# Patient Record
Sex: Male | Born: 1950 | Race: White | Hispanic: No | Marital: Married | Smoking: Never smoker
Health system: Southern US, Community
[De-identification: ages and names within clinical notes are randomized; demographics above are authoritative.]

## PROBLEM LIST (undated history)

## (undated) DIAGNOSIS — L988 Other specified disorders of the skin and subcutaneous tissue: Secondary | ICD-10-CM

## (undated) DIAGNOSIS — I519 Heart disease, unspecified: Secondary | ICD-10-CM

## (undated) DIAGNOSIS — D649 Anemia, unspecified: Secondary | ICD-10-CM

## (undated) DIAGNOSIS — N200 Calculus of kidney: Secondary | ICD-10-CM

## (undated) DIAGNOSIS — E11319 Type 2 diabetes mellitus with unspecified diabetic retinopathy without macular edema: Secondary | ICD-10-CM

## (undated) DIAGNOSIS — N529 Male erectile dysfunction, unspecified: Secondary | ICD-10-CM

## (undated) DIAGNOSIS — N400 Enlarged prostate without lower urinary tract symptoms: Secondary | ICD-10-CM

## (undated) DIAGNOSIS — K219 Gastro-esophageal reflux disease without esophagitis: Secondary | ICD-10-CM

## (undated) DIAGNOSIS — Z87442 Personal history of urinary calculi: Secondary | ICD-10-CM

## (undated) DIAGNOSIS — E785 Hyperlipidemia, unspecified: Secondary | ICD-10-CM

## (undated) DIAGNOSIS — I10 Essential (primary) hypertension: Secondary | ICD-10-CM

## (undated) DIAGNOSIS — E119 Type 2 diabetes mellitus without complications: Secondary | ICD-10-CM

## (undated) DIAGNOSIS — E663 Overweight: Secondary | ICD-10-CM

## (undated) DIAGNOSIS — G4730 Sleep apnea, unspecified: Secondary | ICD-10-CM

## (undated) HISTORY — DX: Type 2 diabetes mellitus without complications: E11.9

## (undated) HISTORY — DX: Hyperlipidemia, unspecified: E78.5

## (undated) HISTORY — DX: Gastro-esophageal reflux disease without esophagitis: K21.9

## (undated) HISTORY — DX: Essential (primary) hypertension: I10

## (undated) HISTORY — DX: Heart disease, unspecified: I51.9

## (undated) HISTORY — DX: Benign prostatic hyperplasia without lower urinary tract symptoms: N40.0

## (undated) HISTORY — DX: Male erectile dysfunction, unspecified: N52.9

## (undated) HISTORY — PX: COLONOSCOPY: SHX174

## (undated) HISTORY — DX: Overweight: E66.3

## (undated) HISTORY — DX: Sleep apnea, unspecified: G47.30

## (undated) HISTORY — DX: Calculus of kidney: N20.0

---

## 2003-01-01 HISTORY — PX: ESOPHAGOGASTRODUODENOSCOPY: SHX1529

## 2005-10-12 ENCOUNTER — Ambulatory Visit: Payer: Self-pay | Admitting: Gastroenterology

## 2006-10-17 ENCOUNTER — Ambulatory Visit: Payer: Self-pay | Admitting: Orthopaedic Surgery

## 2006-12-04 ENCOUNTER — Ambulatory Visit: Payer: Self-pay | Admitting: Anesthesiology

## 2006-12-25 ENCOUNTER — Ambulatory Visit: Payer: Self-pay | Admitting: Anesthesiology

## 2007-02-12 ENCOUNTER — Ambulatory Visit: Payer: Self-pay | Admitting: Anesthesiology

## 2009-01-22 HISTORY — PX: BACK SURGERY: SHX140

## 2009-03-01 ENCOUNTER — Ambulatory Visit: Payer: Self-pay | Admitting: Family Medicine

## 2009-06-22 HISTORY — PX: LUMBAR LAMINECTOMY: SHX95

## 2009-09-28 ENCOUNTER — Ambulatory Visit: Payer: Self-pay | Admitting: Neurosurgery

## 2011-02-22 ENCOUNTER — Ambulatory Visit: Payer: Self-pay | Admitting: Gastroenterology

## 2014-12-24 ENCOUNTER — Encounter: Payer: Self-pay | Admitting: *Deleted

## 2014-12-29 ENCOUNTER — Encounter: Payer: Self-pay | Admitting: Urology

## 2014-12-29 ENCOUNTER — Ambulatory Visit (INDEPENDENT_AMBULATORY_CARE_PROVIDER_SITE_OTHER): Payer: Commercial Managed Care - HMO | Admitting: Urology

## 2014-12-29 VITALS — BP 124/64 | HR 69 | Ht 68.0 in | Wt 237.4 lb

## 2014-12-29 DIAGNOSIS — N138 Other obstructive and reflux uropathy: Secondary | ICD-10-CM

## 2014-12-29 DIAGNOSIS — N528 Other male erectile dysfunction: Secondary | ICD-10-CM | POA: Diagnosis not present

## 2014-12-29 DIAGNOSIS — N529 Male erectile dysfunction, unspecified: Secondary | ICD-10-CM

## 2014-12-29 DIAGNOSIS — N401 Enlarged prostate with lower urinary tract symptoms: Secondary | ICD-10-CM | POA: Diagnosis not present

## 2014-12-29 NOTE — Addendum Note (Signed)
Addended by: Michiel CowboyMCGOWAN, Anaija Wissink A on: 12/29/2014 02:36 PM   Modules accepted: Orders, Medications

## 2014-12-29 NOTE — Progress Notes (Signed)
12/29/2014 10:24 AM   Shawn Stevens Mar 08, 1950 [id number]  Referring provider: No referring provider defined for this encounter.  Chief Complaint  Patient presents with  . Benign Prostatic Hypertrophy    1 year recheck    HPI: Patient is a 64 year old white male with BPH with LUTS who presents today for a one year follow up.  BPH WITH LUTS His IPSS score today is 11, which is moderate lower urinary tract symptomatology. He is mostly satisfied with his quality life due to his urinary symptoms.  His major complaint today urgency and post-void dribbling.  He has had these symptoms for over the last two  years.  He denies any dysuria, hematuria or suprapubic pain.  He is not taking tamsulosin 0.4mg  daily because of the side effects.   He also denies any recent fevers, chills, nausea or vomiting.   He does not have a family history of PCa.      IPSS      12/29/14 0900       International Prostate Symptom Score   How often have you had the sensation of not emptying your bladder? Less than 1 in 5     How often have you had to urinate less than every two hours? About half the time     How often have you found you stopped and started again several times when you urinated? Less than half the time     How often have you found it difficult to postpone urination? Almost always     How often have you had a weak urinary stream? Not at All     How often have you had to strain to start urination? Not at All     How many times did you typically get up at night to urinate? None     Total IPSS Score 11     Quality of Life due to urinary symptoms   If you were to spend the rest of your life with your urinary condition just the way it is now how would you feel about that? Mostly Satisfied        Score:  1-7 Mild 8-19 Moderate 20-35 Severe  ERECTILE DYSFUNCTION Patient has had successful erections with the PDE5-inhibitors, but he could not tolerate the side effects.  He and his wife are  comfortable with other expressions of intimacy.      PMH: Past Medical History  Diagnosis Date  . BPH (benign prostatic hypertrophy)   . HTN (hypertension)   . ED (erectile dysfunction)   . Heart disease   . Diabetes mellitus (HCC)   . GERD (gastroesophageal reflux disease)   . HLD (hyperlipidemia)   . Sleep apnea   . Over weight   . Kidney stone     Surgical History: Past Surgical History  Procedure Laterality Date  . Back surgery  2011    Home Medications:    Medication List       This list is accurate as of: 12/29/14 10:24 AM.  Always use your most recent med list.               aspirin EC 81 MG tablet  Take 81 mg by mouth daily.     ferrous sulfate 325 (65 FE) MG tablet  Take by mouth.     insulin NPH-regular Human (70-30) 100 UNIT/ML injection  Commonly known as:  NOVOLIN 70/30  Inject into the skin.     lisinopril 20 MG tablet  Commonly known as:  PRINIVIL,ZESTRIL  Take 20 mg by mouth daily.     Magnesium 250 MG Tabs  Take by mouth.     metFORMIN 1000 MG tablet  Commonly known as:  GLUCOPHAGE  Take 1,000 mg by mouth 2 (two) times daily with a meal.     omeprazole 20 MG capsule  Commonly known as:  PRILOSEC  Take 20 mg by mouth every other day.     pyridoxine 100 MG tablet  Commonly known as:  B-6  Take by mouth.     RELION PRIME TEST test strip  Generic drug:  glucose blood  Use 1 strip via meter twice daily as directed.     simvastatin 10 MG tablet  Commonly known as:  ZOCOR  Take 5 mg by mouth daily.     tamsulosin 0.4 MG Caps capsule  Commonly known as:  FLOMAX  Take 0.4 mg by mouth daily.     VITAMIN B-12 PO  Take by mouth.     VITAMIN D-1000 MAX ST 1000 UNITS tablet  Generic drug:  Cholecalciferol  Take by mouth.        Allergies: No Known Allergies  Family History: Family History  Problem Relation Age of Onset  . Pancreatic cancer Maternal Grandmother   . Heart disease Father   . Kidney disease Neg Hx   .  Prostate cancer Neg Hx     Social History:  reports that he has never smoked. He does not have any smokeless tobacco history on file. He reports that he does not drink alcohol or use illicit drugs.  ROS: UROLOGY Frequent Urination?: No Hard to postpone urination?: Yes Burning/pain with urination?: No Get up at night to urinate?: No Leakage of urine?: Yes Urine stream starts and stops?: Yes Trouble starting stream?: No Do you have to strain to urinate?: No Blood in urine?: No Urinary tract infection?: No Sexually transmitted disease?: No Injury to kidneys or bladder?: No Painful intercourse?: No Weak stream?: No Erection problems?: Yes Penile pain?: No  Gastrointestinal Nausea?: No Vomiting?: No Indigestion/heartburn?: No Diarrhea?: No Constipation?: No  Constitutional Fever: No Night sweats?: No Weight loss?: No Fatigue?: No  Skin Skin rash/lesions?: No Itching?: No  Eyes Blurred vision?: No Double vision?: No  Ears/Nose/Throat Sore throat?: No Sinus problems?: No  Hematologic/Lymphatic Swollen glands?: No Easy bruising?: No  Cardiovascular Leg swelling?: No Chest pain?: No  Respiratory Cough?: No Shortness of breath?: No  Endocrine Excessive thirst?: No  Musculoskeletal Back pain?: No Joint pain?: No  Neurological Headaches?: No Dizziness?: No  Psychologic Depression?: No Anxiety?: No  Physical Exam: BP 124/64 mmHg  Pulse 69  Ht  (1.727 m)  Wt 237 lb 6.4 oz (107.684 kg)  BMI 36.10 kg/m2  GU: No CVA tenderness.  No bladder fullness or masses.  Patient with uncircumcised phallus.  Foreskin easily retracted  Urethral meatus is patent.  No penile discharge. No penile lesions or rashes. Scrotum without lesions, cysts, rashes and/or edema.  Testicles are located scrotally bilaterally. No masses are appreciated in the testicles. Left and right epididymis are normal. Rectal: Patient with  normal sphincter tone. Anus and perineum without  scarring or rashes. No rectal masses are appreciated. Prostate is approximately 45 grams, no nodules are appreciated. Seminal vesicles are normal.   Laboratory Data: PSA History  0.2 ng/mL on 01/08/2012  0.1 ng/mL on 01/07/2013  0.1 ng/mL on 01/07/2014   Assessment & Plan:    1. BPH (benign prostatic hyperplasia) with  LUTS:   IPSS score is 11/2.  He is not bothered by his symptoms and does not want to pursue any  therapies.  He will RTC in one year for IPSS score, exam and PSA.    - PSA  2. Erectile dysfunction:   Patient has ED, but he cannot tolerate the side effects of the PDE5-inhibitors.  He and his wife have discussed this situation and they do not want to pursue any other treatments at this time.   Return in about 1 year (around 12/29/2015) for IPSS score and exam.  Michiel CowboySHANNON Damonta Cossey, [facility]  [facility] [address], [address] [place], [place] [address] [phone]

## 2014-12-30 ENCOUNTER — Telehealth: Payer: Self-pay

## 2014-12-30 LAB — PSA: Prostate Specific Ag, Serum: 0.2 ng/mL (ref 0.0–4.0)

## 2014-12-30 NOTE — Telephone Encounter (Signed)
-----   Message from Harle BattiestShannon A McGowan, PA-C sent at 12/30/2014  8:11 AM EST ----- Patient's PSA is stable.  We will see him in 12 months.  PSA to be drawn before his next appointment.

## 2014-12-30 NOTE — Telephone Encounter (Signed)
Spoke with pt wife, Richrd SoxJoannie, in reference to PSA results. Wife voiced understanding.

## 2015-12-20 ENCOUNTER — Other Ambulatory Visit: Payer: Self-pay

## 2015-12-20 DIAGNOSIS — N401 Enlarged prostate with lower urinary tract symptoms: Secondary | ICD-10-CM

## 2015-12-22 ENCOUNTER — Other Ambulatory Visit: Payer: Commercial Managed Care - HMO

## 2015-12-22 DIAGNOSIS — N401 Enlarged prostate with lower urinary tract symptoms: Secondary | ICD-10-CM

## 2015-12-23 LAB — PSA: PROSTATE SPECIFIC AG, SERUM: 0.2 ng/mL (ref 0.0–4.0)

## 2015-12-29 ENCOUNTER — Encounter: Payer: Self-pay | Admitting: Urology

## 2015-12-29 ENCOUNTER — Ambulatory Visit (INDEPENDENT_AMBULATORY_CARE_PROVIDER_SITE_OTHER): Payer: Commercial Managed Care - HMO | Admitting: Urology

## 2015-12-29 VITALS — BP 133/77 | HR 71 | Ht 68.0 in | Wt 237.3 lb

## 2015-12-29 DIAGNOSIS — N400 Enlarged prostate without lower urinary tract symptoms: Secondary | ICD-10-CM | POA: Diagnosis not present

## 2015-12-29 DIAGNOSIS — N138 Other obstructive and reflux uropathy: Secondary | ICD-10-CM | POA: Diagnosis not present

## 2015-12-29 DIAGNOSIS — N401 Enlarged prostate with lower urinary tract symptoms: Secondary | ICD-10-CM | POA: Diagnosis not present

## 2015-12-29 DIAGNOSIS — N529 Male erectile dysfunction, unspecified: Secondary | ICD-10-CM | POA: Diagnosis not present

## 2015-12-29 LAB — BLADDER SCAN AMB NON-IMAGING: Scan Result: 40

## 2015-12-29 NOTE — Progress Notes (Signed)
12/29/2015 10:08 AM   Shawn Stevens 1951-01-09 [id number]  Referring provider: Myrene Buddy, NP [address] [facility] [place], [place] [address]  Chief Complaint  Patient presents with  . Benign Prostatic Hypertrophy    1 year follow up  . Erectile Dysfunction    HPI: Patient is a 64 year old Caucasian male with BPH with LUTS who presents today for a one year follow up.  BPH WITH LUTS His IPSS score today is 13, which is moderate lower urinary tract symptomatology. He is mostly satisfied with his quality life due to his urinary symptoms.  His PVR is 40 mL.  His previous IPSS score was 11/2.  His major complaint today urgency and post-void dribbling.  He has had these symptoms for over the last three years.  He denies any dysuria, hematuria or suprapubic pain.  He is not taking tamsulosin 0.4mg  daily because of the side effects.   He also denies any recent fevers, chills, nausea or vomiting.   He does not have a family history of PCa.      IPSS    Row Name 12/29/15 0900         International Prostate Symptom Score   How often have you had the sensation of not emptying your bladder? Less than 1 in 5     How often have you had to urinate less than every two hours? About half the time     How often have you found you stopped and started again several times when you urinated? About half the time     How often have you found it difficult to postpone urination? About half the time     How often have you had a weak urinary stream? Less than 1 in 5 times     How often have you had to strain to start urination? Less than 1 in 5 times     How many times did you typically get up at night to urinate? 1 Time     Total IPSS Score 13       Quality of Life due to urinary symptoms   If you were to spend the rest of your life with your urinary condition just the way it is now how would you feel about that? Mostly Satisfied        Score:  1-7  Mild 8-19 Moderate 20-35 Severe  ERECTILE DYSFUNCTION Patient has had successful erections with the PDE5-inhibitors, but he could not tolerate the side effects.  He and his wife are comfortable with other expressions of intimacy.      PMH: Past Medical History:  Diagnosis Date  . BPH (benign prostatic hypertrophy)   . Diabetes mellitus (HCC)   . ED (erectile dysfunction)   . GERD (gastroesophageal reflux disease)   . Heart disease   . HLD (hyperlipidemia)   . HTN (hypertension)   . Kidney stone   . Over weight   . Sleep apnea     Surgical History: Past Surgical History:  Procedure Laterality Date  . BACK SURGERY  2011    Home Medications:    Medication List       Accurate as of 12/29/15 10:08 AM. Always use your most recent med list.          aspirin EC 81 MG tablet Take 81 mg by mouth daily.   ferrous sulfate 325 (65 FE) MG tablet Take by mouth.   insulin NPH-regular Human (  70-30) 100 UNIT/ML injection Commonly known as:  NOVOLIN 70/30 Inject into the skin.   lisinopril 20 MG tablet Commonly known as:  PRINIVIL,ZESTRIL Take 20 mg by mouth daily.   Magnesium 250 MG Tabs Take by mouth.   metFORMIN 1000 MG tablet Commonly known as:  GLUCOPHAGE Take 1,000 mg by mouth 2 (two) times daily with a meal.   omeprazole 20 MG capsule Commonly known as:  PRILOSEC Take 20 mg by mouth every other day.   pyridoxine 100 MG tablet Commonly known as:  B-6 Take by mouth.   RELION PRIME TEST test strip Generic drug:  glucose blood Use 1 strip via meter twice daily as directed.   simvastatin 10 MG tablet Commonly known as:  ZOCOR Take 5 mg by mouth daily.   VITAMIN B-12 PO Take by mouth.   VITAMIN D-1000 MAX ST 1000 units tablet Generic drug:  Cholecalciferol Take by mouth.       Allergies: No Known Allergies  Family History: Family History  Problem Relation Age of Onset  . Pancreatic cancer Maternal Grandmother   . Heart disease Father   .  Kidney disease Neg Hx   . Prostate cancer Neg Hx   . Bladder Cancer Neg Hx     Social History:  reports that he has never smoked. He has never used smokeless tobacco. He reports that he does not drink alcohol or use drugs.  ROS: UROLOGY Frequent Urination?: No Hard to postpone urination?: No Burning/pain with urination?: No Get up at night to urinate?: No Leakage of urine?: Yes Urine stream starts and stops?: No Trouble starting stream?: No Do you have to strain to urinate?: No Blood in urine?: No Urinary tract infection?: No Sexually transmitted disease?: No Injury to kidneys or bladder?: No Painful intercourse?: No Weak stream?: No Erection problems?: Yes Penile pain?: No  Gastrointestinal Nausea?: No Vomiting?: No Indigestion/heartburn?: No Diarrhea?: No Constipation?: No  Constitutional Fever: No Night sweats?: No Weight loss?: No Fatigue?: No  Skin Skin rash/lesions?: No Itching?: No  Eyes Blurred vision?: No Double vision?: No  Ears/Nose/Throat Sore throat?: Yes Sinus problems?: No  Hematologic/Lymphatic Swollen glands?: No Easy bruising?: No  Cardiovascular Leg swelling?: No Chest pain?: No  Respiratory Cough?: Yes Shortness of breath?: No  Endocrine Excessive thirst?: No  Musculoskeletal Back pain?: No Joint pain?: No  Neurological Headaches?: No Dizziness?: No  Psychologic Depression?: No Anxiety?: No  Physical Exam: BP 133/77   Pulse 71   Ht 5\' 8"  (1.727 m)   Wt 237 lb 4.8 oz (107.6 kg)   BMI 36.08 kg/m   GU: No CVA tenderness.  No bladder fullness or masses.  Patient with uncircumcised phallus.  Foreskin easily retracted  Urethral meatus is patent.  No penile discharge. No penile lesions or rashes. Scrotum without lesions, cysts, rashes and/or edema.  Testicles are located scrotally bilaterally. No masses are appreciated in the testicles. Left and right epididymis are normal. Rectal: Patient with  normal sphincter tone.  Anus and perineum without scarring or rashes. No rectal masses are appreciated. Prostate is approximately 45 grams, no nodules are appreciated. Seminal vesicles are normal.   Laboratory Data: PSA History  0.2 ng/mL on 01/08/2012  0.1 ng/mL on 01/07/2013  0.1 ng/mL on 01/07/2014  0.2 ng/mL on 12/22/2015     Assessment & Plan:    1. BPH (benign prostatic hyperplasia) with LUTS:   IPSS score is 13/2.  He is not bothered by his symptoms and does not want to pursue  any  therapies.  He will RTC in one year for IPSS score, exam and PSA.    2. Erectile dysfunction:   Patient has ED, but he cannot tolerate the side effects of the PDE5-inhibitors.  He and his wife have discussed this situation and they do not want to pursue any other treatments at this time.   Return in about 1 year (around 12/28/2016) for IPSS, PSA and exam.  Michiel CowboySHANNON Lawson Mahone, [facility]  [facility] [address], [address] [place], [place] [address] [phone]

## 2016-12-27 NOTE — Progress Notes (Signed)
12/28/2016 7:43 PM   Shawn HouseholderJoseph A Stevens 04/04/1950 [id number]  Referring provider: Myrene BuddyGauger, Sarah Kathryn, NP [address] [place], [place] [address]  Chief Complaint  Patient presents with  . Benign Prostatic Hypertrophy    HPI: Patient is a 66 year old Caucasian male with BPH with LUTS who presents today for a one year follow up.  BPH WITH LUTS His IPSS score today is 12, which is moderate lower urinary tract symptomatology. He is pleased with his quality life due to his urinary symptoms.  His previous IPSS score was 13/2.  His previous PVR was 40 mL.  His major complaint today is post-void dribbling.  He has had these symptoms for over the last three years.  He denies any dysuria, hematuria or suprapubic pain.  He is not taking tamsulosin 0.4mg  daily because of the side effects.   He also denies any recent fevers, chills, nausea or vomiting.   He does not have a family history of PCa.  IPSS    Row Name 12/28/16 0900         International Prostate Symptom Score   How often have you had the sensation of not emptying your bladder?  Less than 1 in 5     How often have you had to urinate less than every two hours?  About half the time     How often have you found you stopped and started again several times when you urinated?  About half the time     How often have you found it difficult to postpone urination?  About half the time     How often have you had a weak urinary stream?  Less than 1 in 5 times     How often have you had to strain to start urination?  Less than 1 in 5 times     How many times did you typically get up at night to urinate?  None     Total IPSS Score  12       Quality of Life due to urinary symptoms   If you were to spend the rest of your life with your urinary condition just the way it is now how would you feel about that?  Pleased        Score:  1-7 Mild 8-19 Moderate 20-35 Severe  ERECTILE DYSFUNCTION Patient has had successful erections with the  PDE5-inhibitors, but he could not tolerate the side effects.  He and his wife are comfortable with other expressions of intimacy.      PMH: Past Medical History:  Diagnosis Date  . BPH (benign prostatic hypertrophy)   . Diabetes mellitus (HCC)   . ED (erectile dysfunction)   . GERD (gastroesophageal reflux disease)   . Heart disease   . HLD (hyperlipidemia)   . HTN (hypertension)   . Kidney stone   . Over weight   . Sleep apnea     Surgical History: Past Surgical History:  Procedure Laterality Date  . BACK SURGERY  2011    Home Medications:  Allergies as of 12/28/2016   No Known Allergies     Medication List        Accurate as of 12/28/16 11:59 PM. Always use your most recent med list.          aspirin EC 81 MG tablet Take 81 mg by mouth daily.   ferrous sulfate 325 (65 FE) MG tablet Take 325 mg by mouth daily with breakfast.   insulin  NPH-regular Human (70-30) 100 UNIT/ML injection Commonly known as:  NOVOLIN 70/30 Inject 50 Units into the skin 2 (two) times daily with a meal.   lisinopril 20 MG tablet Commonly known as:  PRINIVIL,ZESTRIL Take 20 mg by mouth daily.   Magnesium 250 MG Tabs Take 1 tablet by mouth daily.   metFORMIN 1000 MG tablet Commonly known as:  GLUCOPHAGE Take 1,000 mg by mouth 2 (two) times daily with a meal.   omeprazole 20 MG capsule Commonly known as:  PRILOSEC Take 20 mg by mouth every other day.   pyridoxine 100 MG tablet Commonly known as:  B-6 Take 100 mg by mouth daily.   RELION PRIME TEST test strip Generic drug:  glucose blood Use 1 strip via meter twice daily as directed.   simvastatin 10 MG tablet Commonly known as:  ZOCOR Take 5 mg by mouth daily.   VITAMIN B-12 PO Take 500 mg by mouth daily.   VITAMIN D-1000 MAX ST 1000 units tablet Generic drug:  Cholecalciferol Take 1,000 Units by mouth daily.       Allergies: No Known Allergies  Family History: Family History  Problem Relation Age of Onset  .  Pancreatic cancer Maternal Grandmother   . Heart disease Father   . Kidney disease Neg Hx   . Prostate cancer Neg Hx   . Bladder Cancer Neg Hx     Social History:  reports that  has never smoked. he has never used smokeless tobacco. He reports that he does not drink alcohol or use drugs.  ROS: UROLOGY Frequent Urination?: No Hard to postpone urination?: No Burning/pain with urination?: No Get up at night to urinate?: No Leakage of urine?: Yes Urine stream starts and stops?: No Trouble starting stream?: No Do you have to strain to urinate?: No Blood in urine?: No Urinary tract infection?: No Sexually transmitted disease?: No Injury to kidneys or bladder?: No Painful intercourse?: No Weak stream?: No Erection problems?: Yes Penile pain?: No  Gastrointestinal Nausea?: No Vomiting?: No Indigestion/heartburn?: No Diarrhea?: No Constipation?: No  Constitutional Fever: No Night sweats?: No Weight loss?: No Fatigue?: No  Skin Skin rash/lesions?: No Itching?: No  Eyes Blurred vision?: No Double vision?: No  Ears/Nose/Throat Sore throat?: No Sinus problems?: No  Hematologic/Lymphatic Swollen glands?: No Easy bruising?: No  Cardiovascular Leg swelling?: No Chest pain?: No  Respiratory Cough?: No Shortness of breath?: No  Endocrine Excessive thirst?: No  Musculoskeletal Back pain?: No Joint pain?: No  Neurological Headaches?: No Dizziness?: No  Psychologic Depression?: No Anxiety?: No  Physical Exam: BP (!) 141/72   Pulse 66   Ht 5\' 8"  (1.727 m)   Wt 233 lb (105.7 kg)   BMI 35.43 kg/m   GU: No CVA tenderness.  No bladder fullness or masses.  Patient with uncircumcised phallus.  Foreskin easily retracted  Urethral meatus is patent.  No penile discharge. No penile lesions or rashes. Scrotum without lesions, cysts, rashes and/or edema.  Testicles are located scrotally bilaterally. No masses are appreciated in the testicles. Left and right  epididymis are normal. Rectal: Patient with  normal sphincter tone. Anus and perineum without scarring or rashes. No rectal masses are appreciated. Prostate is approximately 45 grams, no nodules are appreciated. Seminal vesicles are normal.   Laboratory Data: PSA History  0.2 ng/mL on 01/08/2012  0.1 ng/mL on 01/07/2013  0.1 ng/mL on 01/07/2014  0.2 ng/mL on 12/22/2015  0.20 ng/mL on 12/28/2016   Assessment & Plan:    1. BPH (  benign prostatic hyperplasia) with LUTS:   IPSS score is 12/1.  He is not bothered by his symptoms and does not want to pursue any  therapies.  He will RTC in one year for IPSS score, exam and PSA.    2. Erectile dysfunction:   Patient has ED, but he cannot tolerate the side effects of the PDE5-inhibitors.  He and his wife have discussed this situation and they do not want to pursue any other treatments at this time.   Return in about 1 year (around 12/28/2017) for IPSS, PSA and exam.  Michiel CowboySHANNON Jeevan Kalla, [facility]  [facility] [address], [address] [place], [place] [address] [phone]

## 2016-12-28 ENCOUNTER — Other Ambulatory Visit
Admission: RE | Admit: 2016-12-28 | Discharge: 2016-12-28 | Disposition: A | Discharge status: Home / Self Care | Payer: Medicare Other | Source: Ambulatory Visit | Attending: Urology | Admitting: Urology

## 2016-12-28 ENCOUNTER — Encounter: Payer: Self-pay | Admitting: Urology

## 2016-12-28 ENCOUNTER — Ambulatory Visit (INDEPENDENT_AMBULATORY_CARE_PROVIDER_SITE_OTHER): Payer: Medicare Other | Admitting: Urology

## 2016-12-28 VITALS — BP 141/72 | HR 66 | Ht 68.0 in | Wt 233.0 lb

## 2016-12-28 DIAGNOSIS — E11319 Type 2 diabetes mellitus with unspecified diabetic retinopathy without macular edema: Secondary | ICD-10-CM | POA: Insufficient documentation

## 2016-12-28 DIAGNOSIS — G4730 Sleep apnea, unspecified: Secondary | ICD-10-CM | POA: Insufficient documentation

## 2016-12-28 DIAGNOSIS — K219 Gastro-esophageal reflux disease without esophagitis: Secondary | ICD-10-CM | POA: Insufficient documentation

## 2016-12-28 DIAGNOSIS — I10 Essential (primary) hypertension: Secondary | ICD-10-CM | POA: Insufficient documentation

## 2016-12-28 DIAGNOSIS — E785 Hyperlipidemia, unspecified: Secondary | ICD-10-CM | POA: Insufficient documentation

## 2016-12-28 DIAGNOSIS — N138 Other obstructive and reflux uropathy: Secondary | ICD-10-CM

## 2016-12-28 DIAGNOSIS — Z87442 Personal history of urinary calculi: Secondary | ICD-10-CM | POA: Diagnosis not present

## 2016-12-28 DIAGNOSIS — E119 Type 2 diabetes mellitus without complications: Secondary | ICD-10-CM | POA: Diagnosis not present

## 2016-12-28 DIAGNOSIS — N529 Male erectile dysfunction, unspecified: Secondary | ICD-10-CM

## 2016-12-28 DIAGNOSIS — N401 Enlarged prostate with lower urinary tract symptoms: Secondary | ICD-10-CM | POA: Insufficient documentation

## 2016-12-28 DIAGNOSIS — E669 Obesity, unspecified: Secondary | ICD-10-CM | POA: Insufficient documentation

## 2016-12-28 LAB — PSA: Prostatic Specific Antigen: 0.2 ng/mL (ref 0.00–4.00)

## 2017-01-01 ENCOUNTER — Telehealth: Payer: Self-pay

## 2017-01-01 NOTE — Telephone Encounter (Signed)
-----   Message from Harle BattiestShannon A McGowan, PA-C sent at 12/31/2016 10:10 AM EST ----- Please let Mr. Shawn Stevens know that his PSA was stable at 0.20.

## 2017-01-01 NOTE — Telephone Encounter (Signed)
Will send a letter

## 2017-01-22 HISTORY — PX: MOHS SURGERY: SHX181

## 2017-01-22 HISTORY — PX: CATARACT EXTRACTION, BILATERAL: SHX1313

## 2018-01-03 ENCOUNTER — Ambulatory Visit: Payer: Medicare Other | Admitting: Urology

## 2018-01-12 NOTE — Progress Notes (Signed)
01/13/2018 8:34 AM   Shawn HouseholderJoseph A Muench 12/30/1950 [id number]  Referring provider: Myrene BuddyGauger, Sarah Kathryn, NP [address] [place], [place] [address]  Chief Complaint  Patient presents with  . Benign Prostatic Hypertrophy    1 year    HPI: Patient is a 67 year old Caucasian male with BPH with LUTS who presents today for a one year follow up.  BPH WITH LUTS His IPSS score today is 10, which is moderate lower urinary tract symptomatology. He is mostly satisfied with his quality life due to his urinary symptoms.  His previous IPSS score was 12/1.  His previous PVR was 40 mL.  His major complaint today is urgency and post-void dribbling.  He is wearing a light incontinence pad.  He has had these symptoms for over the last three years.  He denies any dysuria, hematuria or suprapubic pain.  He is not taking tamsulosin 0.4mg  daily because of the side effects.   He also denies any recent fevers, chills, nausea or vomiting.   He does not have a family history of PCa. IPSS    Row Name 01/13/18 0800         International Prostate Symptom Score   How often have you had the sensation of not emptying your bladder?  Less than 1 in 5     How often have you had to urinate less than every two hours?  Less than half the time     How often have you found you stopped and started again several times when you urinated?  About half the time     How often have you found it difficult to postpone urination?  Less than half the time     How often have you had a weak urinary stream?  Less than 1 in 5 times     How often have you had to strain to start urination?  Less than 1 in 5 times     How many times did you typically get up at night to urinate?  None     Total IPSS Score  10       Quality of Life due to urinary symptoms   If you were to spend the rest of your life with your urinary condition just the way it is now how would you feel about that?  Mostly Satisfied        Score:  1-7 Mild 8-19  Moderate 20-35 Severe  ERECTILE DYSFUNCTION Patient has had successful erections with the PDE5-inhibitors, but he could not tolerate the side effects.  He and his wife are comfortable with other expressions of intimacy.      PMH: Past Medical History:  Diagnosis Date  . BPH (benign prostatic hypertrophy)   . Diabetes mellitus (HCC)   . ED (erectile dysfunction)   . GERD (gastroesophageal reflux disease)   . Heart disease   . HLD (hyperlipidemia)   . HTN (hypertension)   . Kidney stone   . Over weight   . Sleep apnea     Surgical History: Past Surgical History:  Procedure Laterality Date  . BACK SURGERY  2011    Home Medications:  Allergies as of 01/13/2018   No Known Allergies     Medication List       Accurate as of January 13, 2018  8:34 AM. Always use your most recent med list.        aspirin EC 81 MG tablet Take 81 mg by mouth daily.  ferrous sulfate 325 (65 FE) MG tablet Take 325 mg by mouth daily with breakfast.   insulin NPH-regular Human (70-30) 100 UNIT/ML injection Inject 50 Units into the skin 2 (two) times daily with a meal.   lisinopril 20 MG tablet Commonly known as:  PRINIVIL,ZESTRIL Take 20 mg by mouth daily.   Magnesium 250 MG Tabs Take 1 tablet by mouth daily.   metFORMIN 1000 MG tablet Commonly known as:  GLUCOPHAGE Take 1,000 mg by mouth 2 (two) times daily with a meal.   omeprazole 20 MG capsule Commonly known as:  PRILOSEC Take 20 mg by mouth every other day.   pyridoxine 100 MG tablet Commonly known as:  B-6 Take 100 mg by mouth daily.   RELION PRIME TEST test strip Generic drug:  glucose blood Use 1 strip via meter twice daily as directed.   simvastatin 10 MG tablet Commonly known as:  ZOCOR Take 5 mg by mouth daily.   VITAMIN B-12 PO Take 500 mg by mouth daily.   VITAMIN D-1000 MAX ST 25 MCG (1000 UT) tablet Generic drug:  Cholecalciferol Take 1,000 Units by mouth daily.       Allergies: No Known  Allergies  Family History: Family History  Problem Relation Age of Onset  . Pancreatic cancer Maternal Grandmother   . Heart disease Father   . Kidney disease Neg Hx   . Prostate cancer Neg Hx   . Bladder Cancer Neg Hx     Social History:  reports that he has never smoked. He has never used smokeless tobacco. He reports that he does not drink alcohol or use drugs.  ROS: UROLOGY Frequent Urination?: No Hard to postpone urination?: Yes Burning/pain with urination?: No Get up at night to urinate?: No Leakage of urine?: Yes Urine stream starts and stops?: No Trouble starting stream?: No Do you have to strain to urinate?: No Blood in urine?: No Urinary tract infection?: No Sexually transmitted disease?: No Injury to kidneys or bladder?: No Painful intercourse?: No Weak stream?: No Erection problems?: Yes Penile pain?: No  Gastrointestinal Nausea?: No Vomiting?: No Indigestion/heartburn?: No Diarrhea?: No Constipation?: No  Constitutional Fever: No Night sweats?: No Weight loss?: No Fatigue?: No  Skin Skin rash/lesions?: No Itching?: No  Eyes Blurred vision?: No Double vision?: No  Ears/Nose/Throat Sore throat?: No Sinus problems?: No  Hematologic/Lymphatic Swollen glands?: No Easy bruising?: No  Cardiovascular Leg swelling?: No Chest pain?: No  Respiratory Cough?: No Shortness of breath?: No  Endocrine Excessive thirst?: No  Musculoskeletal Back pain?: No Joint pain?: No  Neurological Headaches?: No Dizziness?: No  Psychologic Depression?: No Anxiety?: No  Physical Exam: BP 138/65   Pulse 76   Ht 5\' 8"  (1.727 m)   Wt 230 lb (104.3 kg)   BMI 34.97 kg/m   Constitutional: Well nourished. Alert and oriented, No acute distress. HEENT: [place] AT, moist mucus membranes. Trachea midline, no masses. Cardiovascular: No clubbing, cyanosis, or edema. Respiratory: Normal respiratory effort, no increased work of breathing. GI: Obese.  Abdomen  is soft, non tender, non distended, no abdominal masses. Liver and spleen not palpable.  No hernias appreciated.  Stool sample for occult testing is not indicated.   GU: No CVA tenderness.  No bladder fullness or masses.  Patient with uncircumcised phallus.  Foreskin easily retracted  Urethral meatus is patent.  No penile discharge. No penile lesions or rashes. Scrotum without lesions, cysts, rashes and/or edema.  Testicles are located scrotally bilaterally. No masses are appreciated in the  testicles. Left and right epididymis are normal. Rectal: Patient with  normal sphincter tone. Anus and perineum without scarring or rashes. No rectal masses are appreciated. Prostate is approximately 45 grams, no nodules are appreciated. Seminal vesicles are normal.  Scarring from piloidal cysts.  Skin: No rashes, bruises or suspicious lesions. Lymph: No cervical or inguinal adenopathy. Neurologic: Grossly intact, no focal deficits, moving all 4 extremities. Psychiatric: Normal mood and affect.   Laboratory Data: PSA History  0.2 ng/mL on 01/08/2012  0.1 ng/mL on 01/07/2013  0.13 in 10/2013  0.1 ng/mL on 01/07/2014  0.14 in 10/2015  0.2 ng/mL on 12/22/2015  0.20 ng/mL on 12/28/2016  0.12 in 10/2017 I have reviewed the labs.   Assessment & Plan:    1. BPH (benign prostatic hyperplasia) with LUTS:   IPSS score is 10/2.  It is stable.  He is not bothered by his symptoms and does not want to pursue any  therapies.  He will RTC in one year for IPSS score, exam and PSA.    2. Erectile dysfunction:   Patient has ED, but he cannot tolerate the side effects of the PDE5-inhibitors.  He and his wife have discussed this situation and they do not want to pursue any other treatments at this time.   Return in about 1 year (around 01/14/2019) for IPSS, PSA and exam.  Michiel CowboySHANNON Inocencia Murtaugh, [facility]  [facility] [address] [place], [place] [address] [phone]

## 2018-01-13 ENCOUNTER — Encounter: Payer: Self-pay | Admitting: Urology

## 2018-01-13 ENCOUNTER — Ambulatory Visit (INDEPENDENT_AMBULATORY_CARE_PROVIDER_SITE_OTHER): Payer: Medicare Other | Admitting: Urology

## 2018-01-13 VITALS — BP 138/65 | HR 76 | Ht 68.0 in | Wt 230.0 lb

## 2018-01-13 DIAGNOSIS — N529 Male erectile dysfunction, unspecified: Secondary | ICD-10-CM

## 2018-01-13 DIAGNOSIS — N138 Other obstructive and reflux uropathy: Secondary | ICD-10-CM

## 2018-01-13 DIAGNOSIS — N401 Enlarged prostate with lower urinary tract symptoms: Secondary | ICD-10-CM | POA: Diagnosis not present

## 2019-01-18 NOTE — Progress Notes (Signed)
01/19/2019 8:54 AM   Shawn Stevens [birth year] [id number]  Referring provider: Sallee Lange, NP [address] [place],  [place] [address]  Chief Complaint  Patient presents with  . Benign Prostatic Hypertrophy    HPI: Shawn Stevens is a 68 year old male with BPH with LUTS and ED who presents today for a one year follow up.  BPH WITH LUTS  (prostate and/or bladder) IPSS score: 12/2    Previous score: 12/1   Previous PVR: 40 mL  Major complaint(s):  Urgency, incontinence and intermittency x several years. Denies any dysuria, hematuria or suprapubic pain.   Has tried tamsulosin 0.4 mg and it didn't agree with him.    Denies any recent fevers, chills, nausea or vomiting.  He does not have a family history of PCa.  IPSS    Row Name 01/19/19 0800         International Prostate Symptom Score   How often have you had the sensation of not emptying your bladder?  Less than 1 in 5     How often have you had to urinate less than every two hours?  Less than half the time     How often have you found you stopped and started again several times when you urinated?  About half the time     How often have you found it difficult to postpone urination?  About half the time     How often have you had a weak urinary stream?  Less than half the time     How often have you had to strain to start urination?  Less than 1 in 5 times     How many times did you typically get up at night to urinate?  None     Total IPSS Score  12       Quality of Life due to urinary symptoms   If you were to spend the rest of your life with your urinary condition just the way it is now how would you feel about that?  Mostly Satisfied        Score:  1-7 Mild 8-19 Moderate 20-35 Severe   Erectile dysfunction SHIM score: 5     Main complaint: No erections  x several years Risk factors:  age, BPH, DM, HTN, HLD and sleep apnea.   No painful erections or curvatures with his erections.    No longer having  spontaneous erections.  Tried:  PDE5i's - but could not tolerate the side effects    SHIM    Row Name 01/19/19 0842         SHIM: Over the last 6 months:   How do you rate your confidence that you could get and keep an erection?  Very Low     When you had erections with sexual stimulation, how often were your erections hard enough for penetration (entering your partner)?  Almost Never or Never     During sexual intercourse, how often were you able to maintain your erection after you had penetrated (entered) your partner?  Almost Never or Never     During sexual intercourse, how difficult was it to maintain your erection to completion of intercourse?  Extremely Difficult     When you attempted sexual intercourse, how often was it satisfactory for you?  Almost Never or Never       SHIM Total Score   SHIM  5        Score: 1-7 Severe  ED 8-11 Moderate ED 12-16 Mild-Moderate ED 17-21 Mild ED 22-25 No ED      PMH: Past Medical History:  Diagnosis Date  . BPH (benign prostatic hypertrophy)   . Diabetes mellitus (HCC)   . ED (erectile dysfunction)   . GERD (gastroesophageal reflux disease)   . Heart disease   . HLD (hyperlipidemia)   . HTN (hypertension)   . Kidney stone   . Over weight   . Sleep apnea     Surgical History: Past Surgical History:  Procedure Laterality Date  . BACK SURGERY  2011    Home Medications:  Allergies as of 01/19/2019   No Known Allergies     Medication List       Accurate as of January 19, 2019  8:54 AM. If you have any questions, ask your nurse or doctor.        Aspirin Buf(CaCarb-MgCarb-MgO) 81 MG Tabs Take by mouth.   aspirin EC 81 MG tablet Take 81 mg by mouth daily.   ferrous sulfate 325 (65 FE) MG tablet Take 325 mg by mouth daily with breakfast.   insulin NPH-regular Human (70-30) 100 UNIT/ML injection Inject 50 Units into the skin 2 (two) times daily with a meal.   INSULIN SYRINGE .5CC/31GX5/16" 31G X 5/16" 0.5 ML  Misc as directed (twice daily insulin dosing.)   lisinopril 20 MG tablet Commonly known as: ZESTRIL Take 20 mg by mouth daily.   Magnesium 250 MG Tabs Take 1 tablet by mouth daily.   metFORMIN 1000 MG tablet Commonly known as: GLUCOPHAGE Take 1,000 mg by mouth 2 (two) times daily with a meal.   omeprazole 20 MG capsule Commonly known as: PRILOSEC Take 20 mg by mouth every other day.   pyridoxine 100 MG tablet Commonly known as: B-6 Take 100 mg by mouth daily.   ReliOn Prime Test test strip Generic drug: glucose blood Use 1 strip via meter twice daily as directed.   simvastatin 10 MG tablet Commonly known as: ZOCOR Take 5 mg by mouth daily.   VITAMIN B-12 PO Take 500 mg by mouth daily.   Vitamin D-1000 Max St 25 MCG (1000 UT) tablet Generic drug: Cholecalciferol Take 1,000 Units by mouth daily.       Allergies: No Known Allergies  Family History: Family History  Problem Relation Age of Onset  . Pancreatic cancer Maternal Grandmother   . Heart disease Father   . Kidney disease Neg Hx   . Prostate cancer Neg Hx   . Bladder Cancer Neg Hx     Social History:  reports that he has never smoked. He has never used smokeless tobacco. He reports that he does not drink alcohol or use drugs.  ROS: UROLOGY Frequent Urination?: No Hard to postpone urination?: Yes Burning/pain with urination?: No Get up at night to urinate?: No Leakage of urine?: Yes Urine stream starts and stops?: Yes Trouble starting stream?: No Do you have to strain to urinate?: No Blood in urine?: No Urinary tract infection?: No Sexually transmitted disease?: No Injury to kidneys or bladder?: No Painful intercourse?: No Weak stream?: No Erection problems?: Yes Penile pain?: No  Gastrointestinal Nausea?: No Vomiting?: No Indigestion/heartburn?: No Diarrhea?: No Constipation?: No  Constitutional Fever: No Night sweats?: No Weight loss?: No Fatigue?: No  Skin Skin rash/lesions?:  No Itching?: No  Eyes Blurred vision?: No Double vision?: No  Ears/Nose/Throat Sore throat?: No Sinus problems?: No  Hematologic/Lymphatic Swollen glands?: No Easy bruising?: No  Cardiovascular Leg swelling?: No  Chest pain?: No  Respiratory Cough?: No Shortness of breath?: No  Endocrine Excessive thirst?: No  Musculoskeletal Back pain?: No Joint pain?: No  Neurological Headaches?: No Dizziness?: No  Psychologic Depression?: No Anxiety?: No  Physical Exam: BP 120/74   Pulse 66   Ht 5\' 8"  (1.727 m)   Wt 231 lb 1.6 oz (104.8 kg)   BMI 35.14 kg/m   Constitutional:  Well nourished. Alert and oriented, No acute distress. HEENT: [place] AT, mask in place.  Trachea midline, no masses. Cardiovascular: No clubbing, cyanosis, or edema. Respiratory: Normal respiratory effort, no increased work of breathing. GI: Abdomen is soft, non tender, non distended, no abdominal masses. Liver and spleen not palpable.  Umbilical hernia appreciated.  Stool sample for occult testing is not indicated.   GU: No CVA tenderness.  No bladder fullness or masses.  Patient with uncircumcised phallus. Foreskin easily retracted  Urethral meatus is patent.  No penile discharge. No penile lesions or rashes. Scrotum without lesions, cysts, rashes and/or edema.  Testicles are located scrotally bilaterally. No masses are appreciated in the testicles. Left and right epididymis are normal. Rectal: Patient with  normal sphincter tone. Anus and perineum without scarring or rashes. No rectal masses are appreciated. Prostate is approximately 45 grams, no nodules are appreciated. Seminal vesicles are normal. Skin: No rashes, bruises or suspicious lesions. Lymph: No inguinal adenopathy. Neurologic: Grossly intact, no focal deficits, moving all 4 extremities. Psychiatric: Normal mood and affect.  Laboratory Data: PSA History  0.2 ng/mL on 01/08/2012  0.1 ng/mL on 01/07/2013  0.13 in 10/2013  0.1 ng/mL on  01/07/2014  0.14 in 10/2015  0.2 ng/mL on 12/22/2015  0.20 ng/mL on 12/28/2016  0.12 in 10/2017  Serum creatinine  0.8 in 12/2018  Hbg A1c   6.6% in 12/2018  I have reviewed the labs.   Assessment & Plan:    1. BPH with LUTS IPSS score is 12/2, it is stable Continue conservative management, avoiding bladder irritants and timed voiding's Most bothersome symptoms is/are frequency, but wants to continue to manage conservatively RTC in 12 months for IPSS, PSA and exam    2. Erectile dysfunction SHIM score is 5 Could not tolerate PDE5i's - not interested in other treatments RTC in 12 months for repeat SHIM score and exam    Return in about 1 year (around 01/19/2020) for IPSS, SHIM, PSA and exam.  01/21/2020, [facility]  [facility] [address] [place], [place] [phone]

## 2019-01-19 ENCOUNTER — Encounter: Payer: Self-pay | Admitting: Urology

## 2019-01-19 ENCOUNTER — Ambulatory Visit (INDEPENDENT_AMBULATORY_CARE_PROVIDER_SITE_OTHER): Payer: Medicare Other | Admitting: Urology

## 2019-01-19 ENCOUNTER — Other Ambulatory Visit: Payer: Self-pay

## 2019-01-19 ENCOUNTER — Ambulatory Visit: Payer: PRIVATE HEALTH INSURANCE | Admitting: Urology

## 2019-01-19 VITALS — BP 120/74 | HR 66 | Ht 68.0 in | Wt 231.1 lb

## 2019-01-19 DIAGNOSIS — N138 Other obstructive and reflux uropathy: Secondary | ICD-10-CM

## 2019-01-19 DIAGNOSIS — N529 Male erectile dysfunction, unspecified: Secondary | ICD-10-CM

## 2019-01-19 DIAGNOSIS — N401 Enlarged prostate with lower urinary tract symptoms: Secondary | ICD-10-CM | POA: Diagnosis not present

## 2019-01-20 LAB — PSA: Prostate Specific Ag, Serum: 0.2 ng/mL (ref 0.0–4.0)

## 2019-12-30 ENCOUNTER — Encounter: Payer: Self-pay | Admitting: Emergency Medicine

## 2019-12-30 ENCOUNTER — Ambulatory Visit
Admission: EM | Admit: 2019-12-30 | Discharge: 2019-12-30 | Disposition: A | Discharge status: Home / Self Care | Payer: Medicare Other | Attending: Family Medicine | Admitting: Family Medicine

## 2019-12-30 ENCOUNTER — Other Ambulatory Visit: Payer: Self-pay

## 2019-12-30 DIAGNOSIS — S60419A Abrasion of unspecified finger, initial encounter: Secondary | ICD-10-CM | POA: Diagnosis not present

## 2019-12-30 MED ORDER — MUPIROCIN 2 % EX OINT: 1.0000 "application " | TOPICAL_OINTMENT | Freq: Three times a day (TID) | CUTANEOUS | 0 refills | Status: AC

## 2019-12-30 NOTE — ED Provider Notes (Signed)
[facility]    CSN: [id number] Arrival date & time: 12/30/19  1459   History   Chief Complaint Chief Complaint  Patient presents with  . Finger Injury    left pointer finger   HPI  69 year old male presents with the above complaint.  Patient states that several days ago he injured his left index finger.  He believes that he suffered a cut or an abrasion.  He is unsure of how it occurred.  He states that for the past few days the area has been red and has been slightly tender.  He has cleaned it with peroxide and used antibiotic ointment without significant improvement.  He is concerned that it is getting infected.  No purulent drainage.  No other associated symptoms.  No other complaints.  Past Medical History:  Diagnosis Date  . BPH (benign prostatic hypertrophy)   . Diabetes mellitus (HCC)   . ED (erectile dysfunction)   . GERD (gastroesophageal reflux disease)   . Heart disease   . HLD (hyperlipidemia)   . HTN (hypertension)   . Kidney stone   . Over weight   . Sleep apnea     Patient Active Problem List   Diagnosis Date Noted  . Sleep apnea 12/28/2016  . Obesity (BMI 30-39.9) 12/28/2016  . Hypertension 12/28/2016  . Hyperlipidemia, unspecified 12/28/2016  . GERD (gastroesophageal reflux disease) 12/28/2016  . Diabetic retinopathy associated with type 2 diabetes mellitus (HCC) 12/28/2016  . BPH with obstruction/lower urinary tract symptoms 12/29/2014  . 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 Diabetes mellitus, type 2 (HCC) 01/23/1991    Past Surgical History:  Procedure Laterality Date  . BACK SURGERY  2011       Home Medications    Prior to Admission medications   Medication Sig Start Date End Date Taking? Authorizing Provider  Aspirin Buf,CaCarb-MgCarb-MgO, 81 MG TABS Take by mouth. 05/27/09  Yes [provider]  aspirin EC 81 MG tablet Take 81 mg by mouth daily.   Yes [provider]  Cholecalciferol (VITAMIN D-1000 MAX  ST) 1000 UNITS tablet Take 1,000 Units by mouth daily.    Yes [provider]  Cyanocobalamin (VITAMIN B-12 PO) Take 500 mg by mouth daily.    Yes [provider]  ferrous sulfate 325 (65 FE) MG tablet Take 325 mg by mouth daily with breakfast.    Yes [provider]  glucose blood (RELION PRIME TEST) test strip Use 1 strip via meter twice daily as directed. 10/29/14  Yes [provider]  insulin NPH-regular Human (NOVOLIN 70/30) (70-30) 100 UNIT/ML injection Inject 50 Units into the skin 2 (two) times daily with a meal.    Yes [provider]  Insulin Syringe-Needle U-100 (INSULIN SYRINGE .5CC/31GX5/16") 31G X 5/16" 0.5 ML MISC as directed (twice daily insulin dosing.) 01/13/19  Yes [provider]  lisinopril (PRINIVIL,ZESTRIL) 20 MG tablet Take 20 mg by mouth daily.   Yes [provider]  Magnesium 250 MG TABS Take 1 tablet by mouth daily.    Yes [provider]  metFORMIN (GLUCOPHAGE) 1000 MG tablet Take 1,000 mg by mouth 2 (two) times daily with a meal.   Yes [provider]  omeprazole (PRILOSEC) 20 MG capsule Take 20 mg by mouth every other day.    Yes [provider]  pyridoxine (B-6) 100 MG tablet Take 100 mg by mouth daily.    Yes [provider]  simvastatin (ZOCOR) 10 MG tablet Take 5 mg by  mouth daily.    Yes [provider]  mupirocin ointment (BACTROBAN) 2 % Apply 1 application topically 3 (three) times daily for 7 days. 12/30/19 01/06/20  Shawn Sams, DO    Family History Family History  Problem Relation Age of Onset  . Pancreatic cancer Maternal Grandmother   . Heart disease Father   . Kidney disease Neg Hx   . Prostate cancer Neg Hx   . Bladder Cancer Neg Hx     Social History Social History   Tobacco Use  . Smoking status: Never Smoker  . Smokeless tobacco: Never Used  Vaping Use  . Vaping Use: Never used  Substance Use Topics  . Alcohol use: No     Alcohol/week: 0.0 standard drinks  . Drug use: No     Allergies   Patient has no known allergies.   Review of Systems Review of Systems  Musculoskeletal:       Finger injury; redness   Physical Exam Triage Vital Signs ED Triage Vitals  Enc Vitals Group     BP 12/30/19 1552 130/70     Pulse Rate 12/30/19 1552 66     Resp 12/30/19 1552 18     Temp 12/30/19 1552 98.4 F (36.9 C)     Temp Source 12/30/19 1552 Oral     SpO2 12/30/19 1552 99 %     Weight 12/30/19 1550 225 lb (102.1 kg)     Height 12/30/19 1550 5\' 8"  (1.727 m)     Head Circumference --      Peak Flow --      Pain Score 12/30/19 1550 0     Pain Loc --      Pain Edu? --      Excl. in GC? --    Updated Vital Signs BP 130/70 (BP Location: Right Arm)   Pulse 66   Temp 98.4 F (36.9 C) (Oral)   Resp 18   Ht 5\' 8"  (1.727 m)   Wt 102.1 kg   SpO2 99%   BMI 34.21 kg/m   Visual Acuity Right Eye Distance:   Left Eye Distance:   Bilateral Distance:    Right Eye Near:   Left Eye Near:    Bilateral Near:     Physical Exam Vitals and nursing note reviewed.  Constitutional:      General: He is not in acute distress.    Appearance: Normal appearance. He is not ill-appearing.  HENT:     Head: Normocephalic and atraumatic.  Eyes:     General:        Right eye: No discharge.        Left eye: No discharge.     Conjunctiva/sclera: Conjunctivae normal.  Skin:    Comments: Left index finger - dorsum of the finger above the MCP joint with an area of erythema with central eschar.  Slightly tender to palpation.  Neurological:     Mental Status: He is alert.  Psychiatric:        Mood and Affect: Mood normal.        Behavior: Behavior normal.    UC Treatments / Results  Labs (all labs ordered are listed, but only abnormal results are displayed) Labs Reviewed - No data to display  EKG   Radiology No results found.  Procedures Procedures (including critical care time)  Medications Ordered in  UC Medications - No data to display  Initial Impression / Assessment and Plan / UC Course  I have reviewed  the triage vital signs and the nursing notes.  Pertinent labs & imaging results that were available during my care of the patient were reviewed by me and considered in my medical decision making (see chart for details).    69 year old male presents with an abrasion to his left index finger.  He has mild surrounding erythema and slight tenderness.  No fluctuance.  Placing on Bactroban ointment.  Advised to keep clean.  Advised to discontinue use of peroxide.  Final Clinical Impressions(s) / UC Diagnoses   Final diagnoses:  Abrasion of finger, initial encounter     Discharge Instructions     Medication as prescribed.  Take care  Dr. Adriana Simas    ED Prescriptions    Medication Sig Dispense Auth. Provider   mupirocin ointment (BACTROBAN) 2 % Apply 1 application topically 3 (three) times daily for 7 days. 30 g Shawn Sams, DO     PDMP not reviewed this encounter.   Shawn Stevens, [place] 12/30/19 1730

## 2019-12-30 NOTE — Discharge Instructions (Signed)
Medication as prescribed.  Take care  Dr. Dwanda Tufano  

## 2019-12-30 NOTE — ED Triage Notes (Signed)
Patient states he got a cut on his left finger x 1 week ago. He states he doesn't recall what he cut his finger on. He is c/o redness and pain at the site.

## 2020-01-04 ENCOUNTER — Other Ambulatory Visit: Payer: Self-pay

## 2020-01-04 ENCOUNTER — Other Ambulatory Visit
Admission: RE | Admit: 2020-01-04 | Discharge: 2020-01-04 | Disposition: A | Discharge status: Home / Self Care | Payer: Medicare Other | Attending: Urology | Admitting: Urology

## 2020-01-04 ENCOUNTER — Other Ambulatory Visit: Payer: Self-pay | Admitting: *Deleted

## 2020-01-04 DIAGNOSIS — N138 Other obstructive and reflux uropathy: Secondary | ICD-10-CM | POA: Diagnosis present

## 2020-01-04 DIAGNOSIS — N401 Enlarged prostate with lower urinary tract symptoms: Secondary | ICD-10-CM | POA: Insufficient documentation

## 2020-01-04 LAB — PSA: Prostatic Specific Antigen: 0.16 ng/mL (ref 0.00–4.00)

## 2020-01-08 ENCOUNTER — Other Ambulatory Visit: Payer: Self-pay | Admitting: *Deleted

## 2020-01-08 DIAGNOSIS — N138 Other obstructive and reflux uropathy: Secondary | ICD-10-CM

## 2020-01-10 NOTE — Progress Notes (Signed)
01/11/2020 9:03 AM   Shawn Stevens 1950-09-02 [id number]  Referring provider: Sallee Lange, NP [address] [place],  [place] [address]  Chief Complaint  Patient presents with  . Benign Prostatic Hypertrophy    1year follow up    HPI: Shawn Stevens is a 69 year old male with BPH with LUTS and ED who presents today for a one year follow up.  BPH WITH LUTS  (prostate and/or bladder) IPSS score: 9/2    Previous score: 12/2   Previous PVR: 40 mL  Major complaint(s):  Post void dribbling.  Patient denies any modifying or aggravating factors.  Patient denies any gross hematuria, dysuria or suprapubic/flank pain.  Patient denies any fevers, chills, nausea or vomiting.   Has tried tamsulosin 0.4 mg and it didn't agree with him.    He does not have a family history of PCa.   IPSS    Row Name 01/11/20 0800         International Prostate Symptom Score   How often have you had the sensation of not emptying your bladder? Less than 1 in 5     How often have you had to urinate less than every two hours? Less than half the time     How often have you found you stopped and started again several times when you urinated? Less than half the time     How often have you found it difficult to postpone urination? Less than half the time     How often have you had a weak urinary stream? Less than 1 in 5 times     How often have you had to strain to start urination? Less than 1 in 5 times     How many times did you typically get up at night to urinate? None     Total IPSS Score 9           Quality of Life due to urinary symptoms   If you were to spend the rest of your life with your urinary condition just the way it is now how would you feel about that? Mostly Satisfied            Score:  1-7 Mild 8-19 Moderate 20-35 Severe   Erectile dysfunction Previous SHIM score: 5     Main complaint: No erections  x several years Risk factors:  age, BPH, DM, HTN, HLD and sleep apnea.    No painful erections or curvatures with his erections.    No longer having spontaneous erections.  Tried:  PDE5i's - but could not tolerate the side effects      PMH: Past Medical History:  Diagnosis Date  . BPH (benign prostatic hypertrophy)   . Diabetes mellitus ([facility])   . ED (erectile dysfunction)   . GERD (gastroesophageal reflux disease)   . Heart disease   . HLD (hyperlipidemia)   . HTN (hypertension)   . Kidney stone   . Over weight   . Sleep apnea     Surgical History: Past Surgical History:  Procedure Laterality Date  . BACK SURGERY  2011    Home Medications:  Allergies as of 01/11/2020   No Known Allergies     Medication List       Accurate as of January 11, 2020  9:03 AM. If you have any questions, ask your nurse or doctor.        STOP taking these medications   aspirin EC 81 MG tablet  Stopped by: Zara Council, PA-C     TAKE these medications   Aspirin Buf(CaCarb-MgCarb-MgO) 81 MG Tabs Take by mouth.   Cholecalciferol 25 MCG (1000 UT) tablet Take 1,000 Units by mouth daily.   ferrous sulfate 325 (65 FE) MG tablet Take 325 mg by mouth daily with breakfast.   glucose blood test strip Use 1 strip via meter twice daily as directed.   insulin NPH-regular Human (70-30) 100 UNIT/ML injection Inject 50 Units into the skin 2 (two) times daily with a meal.   INSULIN SYRINGE .5CC/31GX5/16" 31G X 5/16" 0.5 ML Misc as directed (twice daily insulin dosing.)   lisinopril 20 MG tablet Commonly known as: ZESTRIL Take 20 mg by mouth daily.   Magnesium 250 MG Tabs Take 1 tablet by mouth daily.   metFORMIN 1000 MG tablet Commonly known as: GLUCOPHAGE Take 1,000 mg by mouth 2 (two) times daily with a meal.   omeprazole 20 MG capsule Commonly known as: PRILOSEC Take 20 mg by mouth every other day.   pyridoxine 100 MG tablet Commonly known as: B-6 Take 100 mg by mouth daily.   simvastatin 10 MG tablet Commonly known as: ZOCOR Take 5 mg by  mouth daily.   VITAMIN B-12 PO Take 500 mg by mouth daily.       Allergies: No Known Allergies  Family History: Family History  Problem Relation Age of Onset  . Pancreatic cancer Maternal Grandmother   . Heart disease Father   . Kidney disease Neg Hx   . Prostate cancer Neg Hx   . Bladder Cancer Neg Hx     Social History:  reports that he has never smoked. He has never used smokeless tobacco. He reports that he does not drink alcohol and does not use drugs.  ROS: For pertinent review of systems please refer to history of present illness  Physical Exam: BP 135/62   Pulse 63   Ht 5' 8"  (1.727 m)   Wt 225 lb (102.1 kg)   BMI 34.21 kg/m   Constitutional:  Well nourished. Alert and oriented, No acute distress. HEENT: [place] AT, mask in place.  Trachea midline Cardiovascular: No clubbing, cyanosis, or edema. Respiratory: Normal respiratory effort, no increased work of breathing. GU: No CVA tenderness.  No bladder fullness or masses.  Patient with uncircumcised phallus.  Foreskin easily retracted  Urethral meatus is patent.  No penile discharge. No penile lesions or rashes. Scrotum without lesions, cysts, rashes and/or edema.  Testicles are located scrotally bilaterally. No masses are appreciated in the testicles. Left and right epididymis are normal. Rectal: Patient with  normal sphincter tone. Anus and perineum without scarring or rashes. No rectal masses are appreciated. Prostate is approximately 50 grams, no nodules are appreciated. Seminal vesicles could not be palpated.  A scarred area is located at the upper right of the gluteal fold which is an sebaceous cyst that becomes infected and drains from time to time.  There is no erythema, fluctuance or crepitus on today's exam Lymph: No inguinal adenopathy. Neurologic: Grossly intact, no focal deficits, moving all 4 extremities. Psychiatric: Normal mood and affect.  Laboratory Data: Urinalysis Component     Latest Ref Rng & Units  01/11/2020  Color, Urine     YELLOW YELLOW  Appearance     CLEAR CLEAR  Specific Gravity, Urine     1.005 - 1.030 1.025  pH     5.0 - 8.0 5.0  Glucose, UA     NEGATIVE mg/dL NEGATIVE  Hgb urine dipstick     NEGATIVE NEGATIVE  Bilirubin Urine     NEGATIVE NEGATIVE  Ketones, ur     NEGATIVE mg/dL TRACE (A)  Protein     NEGATIVE mg/dL NEGATIVE  Nitrite     NEGATIVE NEGATIVE  Leukocytes,Ua     NEGATIVE NEGATIVE  Squamous Epithelial / LPF     0 - 5 0-5  WBC, UA     0 - 5 WBC/hpf NONE SEEN  RBC / HPF     0 - 5 RBC/hpf 0-5  Bacteria, UA     NONE SEEN NONE SEEN    PSA History  0.2 ng/mL on 01/08/2012  0.1 ng/mL on 01/07/2013  0.13 in 10/2013  0.1 ng/mL on 01/07/2014  0.14 in 10/2015  0.2 ng/mL on 12/22/2015  0.12 in 10/2017 Component     Latest Ref Rng & Units 12/28/2016 01/04/2020  Prostatic Specific Antigen     0.00 - 4.00 ng/mL 0.20 0.16   Specimen:  Blood  Ref Range & Units 12 mo ago  Glucose 70 - 110 mg/dL 125High   Sodium 136 - 145 mmol/L 139   Potassium 3.6 - 5.1 mmol/L 4.6   Chloride 97 - 109 mmol/L 103   Carbon Dioxide (CO2) 22 - 32 mmol/L 29.1   Urea Nitrogen (BUN) 7 - 25 mg/dL 20   Creatinine 0.7 - 1.3 mg/dL 0.8   Glomerular Filtration Rate (eGFR), MDRD Estimate >60 mL/min/1.73sq m 96   Calcium 8.7 - 10.3 mg/dL 9.6   AST  8 - 39 U/L 25   ALT  6 - 57 U/L 30   Alk Phos (alkaline Phosphatase) 34 - 104 U/L 43   Albumin 3.5 - 4.8 g/dL 4.3   Bilirubin, Total 0.3 - 1.2 mg/dL 0.5   Protein, Total 6.1 - 7.9 g/dL 6.5   A/G Ratio 1 - 5 gm/dL 2.0   Resulting Agency  [facility]  Specimen Collected: 01/13/19 9:46 AM Last Resulted: 01/14/19 10:28 AM  Received From: [facility]  Result Received: 01/18/19 3:22 PM     Specimen:  Blood  Ref Range & Units 6 mo ago  Hemoglobin A1C 4.2 - 5.6 % 7.3High   Average Blood Glucose (Calc) mg/dL 163   Resulting Agency  [facility] - [facility]   Narrative  Normal Range:  4.2  - 5.6%  Increased Risk: 5.7 - 6.4%  Diabetes:    >= 6.5%  Glycemic Control for adults with diabetes: <7%  Specimen Collected: 07/14/19 8:54 AM Last Resulted: 07/14/19 12:27 PM  Received From: [facility]  Result Received: 12/30/19 3:00 PM  I have reviewed the labs.   Assessment & Plan:    1. BPH with LUTS IPSS score is 12/2, it is stable Continue conservative management, avoiding bladder irritants and timed voiding's Most bothersome symptoms is/are post void dribbling - advised patient to press up behind the scrotum to facilitate complete emptying  RTC in 12 months for IPSS, PSA and exam    2. Erectile dysfunction SHIM score is 5 Could not tolerate PDE5i's - not interested in other treatments  Return in about 1 year (around 01/10/2021) for IPSS, PSA and exam.  Zara Council, [facility]  [address] [place], [place] [address] [phone]

## 2020-01-11 ENCOUNTER — Ambulatory Visit (INDEPENDENT_AMBULATORY_CARE_PROVIDER_SITE_OTHER): Payer: Medicare Other | Admitting: Urology

## 2020-01-11 ENCOUNTER — Encounter: Payer: Self-pay | Admitting: Urology

## 2020-01-11 ENCOUNTER — Other Ambulatory Visit: Payer: Self-pay

## 2020-01-11 ENCOUNTER — Other Ambulatory Visit
Admission: RE | Admit: 2020-01-11 | Discharge: 2020-01-11 | Disposition: A | Discharge status: Home / Self Care | Payer: Medicare Other | Attending: Urology | Admitting: Urology

## 2020-01-11 VITALS — BP 135/62 | HR 63 | Ht 68.0 in | Wt 225.0 lb

## 2020-01-11 DIAGNOSIS — N138 Other obstructive and reflux uropathy: Secondary | ICD-10-CM

## 2020-01-11 DIAGNOSIS — N401 Enlarged prostate with lower urinary tract symptoms: Secondary | ICD-10-CM | POA: Insufficient documentation

## 2020-01-11 LAB — URINALYSIS, COMPLETE (UACMP) WITH MICROSCOPIC
Bacteria, UA: NONE SEEN
Bilirubin Urine: NEGATIVE
Glucose, UA: NEGATIVE mg/dL
Hgb urine dipstick: NEGATIVE
Leukocytes,Ua: NEGATIVE
Nitrite: NEGATIVE
Protein, ur: NEGATIVE mg/dL
Specific Gravity, Urine: 1.025 (ref 1.005–1.030)
WBC, UA: NONE SEEN WBC/hpf (ref 0–5)
pH: 5 (ref 5.0–8.0)

## 2020-04-22 DIAGNOSIS — U071 COVID-19: Secondary | ICD-10-CM

## 2020-04-22 HISTORY — DX: COVID-19: U07.1

## 2020-06-27 ENCOUNTER — Ambulatory Visit
Admission: RE | Admit: 2020-06-27 | Discharge: 2020-06-27 | Disposition: A | Discharge status: Home / Self Care | Payer: Medicare Other | Source: Ambulatory Visit | Attending: Urology | Admitting: Urology

## 2020-06-27 ENCOUNTER — Ambulatory Visit
Admission: RE | Admit: 2020-06-27 | Discharge: 2020-06-27 | Disposition: A | Discharge status: Home / Self Care | Payer: Medicare Other | Attending: Urology | Admitting: Urology

## 2020-06-27 ENCOUNTER — Other Ambulatory Visit: Payer: Self-pay | Admitting: *Deleted

## 2020-06-27 DIAGNOSIS — N200 Calculus of kidney: Secondary | ICD-10-CM

## 2020-06-27 NOTE — Progress Notes (Signed)
06/28/2020 9:49 AM   Shawn Stevens [birth year] [id number]  Referring provider: Sallee Lange, NP [address] [place],  [place] [address]  Chief Complaint  Patient presents with  . Nephrolithiasis   Urological history: 1. BPH with LU TS -PSA 0.16 in 12/2019  2. ED -contributing factors of age,  BPH, DM, HTN, HLD and sleep apnea  3. Sleep apnea -sleeps with CPAP  4. Nephrolithiasis -Passed a stone spontaneously in the 1980s  HPI: Shawn ALOMAR is a 70 y.o. male who presents today for possible kidney stone.  He states that last Tuesday morning he had the sudden onset of left-sided flank pain that radiated to his left waist that woke him up.  Since that time the pain has been intermittent.  He is having some nausea and dry heaves with the pain.  He denies fever, chills and gross hematuria.  He also is not having any other urinary symptoms such as dysuria.    His UA is positive for calcium oxalate crystals otherwise negative.  KUB taken yesterday demonstrates a 5 mm proximal left ureteral stone.   CT Renal stone study today confirms the stone and it is associated with hydronephrosis.   PMH: Past Medical History:  Diagnosis Date  . BPH (benign prostatic hypertrophy)   . Diabetes mellitus (Shawn Stevens)   . ED (erectile dysfunction)   . GERD (gastroesophageal reflux disease)   . Heart disease   . HLD (hyperlipidemia)   . HTN (hypertension)   . Kidney stone   . Over weight   . Sleep apnea     Surgical History: Past Surgical History:  Procedure Laterality Date  . BACK SURGERY  2011    Home Medications:  Allergies as of 06/28/2020   No Known Allergies     Medication List       Accurate as of June 28, 2020  9:49 AM. If you have any questions, ask your nurse or doctor.        Aspirin Buf(CaCarb-MgCarb-MgO) 81 MG Tabs Take by mouth.   Cholecalciferol 25 MCG (1000 UT) tablet Take 1,000 Units by mouth daily.   ferrous sulfate 325 (65 FE) MG tablet Take  325 mg by mouth daily with breakfast.   glucose blood test strip Use 1 strip via meter twice daily as directed.   insulin NPH-regular Human (70-30) 100 UNIT/ML injection Inject 50 Units into the skin 2 (two) times daily with a meal.   INSULIN SYRINGE .5CC/31GX5/16" 31G X 5/16" 0.5 ML Misc as directed (twice daily insulin dosing.)   lisinopril 20 MG tablet Commonly known as: ZESTRIL Take 20 mg by mouth daily.   Magnesium 250 MG Tabs Take 1 tablet by mouth daily.   metFORMIN 1000 MG tablet Commonly known as: GLUCOPHAGE Take 1,000 mg by mouth 2 (two) times daily with a meal.   omeprazole 20 MG capsule Commonly known as: PRILOSEC Take 20 mg by mouth every other day.   pyridoxine 100 MG tablet Commonly known as: B-6 Take 100 mg by mouth daily.   simvastatin 10 MG tablet Commonly known as: ZOCOR Take 5 mg by mouth daily.   VITAMIN B-12 PO Take 500 mg by mouth daily.       Allergies: No Known Allergies  Family History: Family History  Problem Relation Age of Onset  . Pancreatic cancer Maternal Grandmother   . Heart disease Father   . Kidney disease Neg Hx   . Prostate cancer Neg Hx   . Bladder Cancer  Neg Hx     Social History:  reports that he has never smoked. He has never used smokeless tobacco. He reports that he does not drink alcohol and does not use drugs.  ROS: For pertinent review of systems please refer to history of present illness  Physical Exam: BP (!) 149/75   Pulse 73   Ht 5' 8"  (1.727 m)   Wt 225 lb (102.1 kg)   BMI 34.21 kg/m   Constitutional:  Well nourished. Alert and oriented, No acute distress. HEENT: [place] AT, mask in place.  Trachea midline Cardiovascular: No clubbing, cyanosis, or edema. Respiratory: Normal respiratory effort, no increased work of breathing. Neurologic: Grossly intact, no focal deficits, moving all 4 extremities. Psychiatric: Normal mood and affect.  Laboratory Data: WBC (White Blood Cell Count) 4.1 - 10.2 10^3/uL  8.5   RBC (Red Blood Cell Count) 4.69 - 6.13 10^6/uL 5.21   Hemoglobin 14.1 - 18.1 gm/dL 15.7   Hematocrit 40.0 - 52.0 % 47.4   MCV (Mean Corpuscular Volume) 80.0 - 100.0 fl 91.0   MCH (Mean Corpuscular Hemoglobin) 27.0 - 31.2 pg 30.1   MCHC (Mean Corpuscular Hemoglobin Concentration) 32.0 - 36.0 gm/dL 33.1   Platelet Count 150 - 450 10^3/uL 241   RDW-CV (Red Cell Distribution Width) 11.6 - 14.8 % 12.7   MPV (Mean Platelet Volume) 9.4 - 12.4 fl 9.0Low   Neutrophils 1.50 - 7.80 10^3/uL 5.60   Lymphocytes 1.00 - 3.60 10^3/uL 2.10   Mixed Count 0.10 - 0.90 10^3/uL 0.80   Neutrophil % 32.0 - 70.0 % 65.7   Lymphocyte % 10.0 - 50.0 % 25.2   Mixed % 3.0 - 14.4 % 9.1   Resulting Agency  [facility] - [facility]  Specimen Collected: 01/28/20 10:48 Last Resulted: 01/28/20 11:24  Received From: [facility]  Result Received: 06/27/20 11:03   Glucose 70 - 110 mg/dL 94   Sodium 136 - 145 mmol/L 139   Potassium 3.6 - 5.1 mmol/L 5.1   Chloride 97 - 109 mmol/L 104   Carbon Dioxide (CO2) 22.0 - 32.0 mmol/L 30.8   Urea Nitrogen (BUN) 7 - 25 mg/dL 23   Creatinine 0.7 - 1.3 mg/dL 0.9   Glomerular Filtration Rate (eGFR), MDRD Estimate >60 mL/min/1.73sq m 84   Calcium 8.7 - 10.3 mg/dL 9.6   AST  8 - 39 U/L 26   ALT  6 - 57 U/L 29   Alk Phos (alkaline Phosphatase) 34 - 104 U/L 45   Albumin 3.5 - 4.8 g/dL 4.4   Bilirubin, Total 0.3 - 1.2 mg/dL 0.7   Protein, Total 6.1 - 7.9 g/dL 6.8   A/G Ratio 1.0 - 5.0 gm/dL 1.8   Resulting Agency  [facility] - [facility]  Specimen Collected: 01/28/20 10:48 Last Resulted: 01/28/20 16:47  Received From: [facility]  Result Received: 06/27/20 11:03   Hemoglobin A1C 4.2 - 5.6 % 6.4High   Average Blood Glucose (Calc) mg/dL 137   Resulting Agency  [facility] - [facility]   Narrative Performed by [facility] - [facility] Normal Range:  4.2 - 5.6%  Increased Risk: 5.7 - 6.4%  Diabetes:    >= 6.5%  Glycemic  Control for adults with diabetes: <7%  Specimen Collected: 01/28/20 10:48 Last Resulted: 01/28/20 17:31  Received From: [facility]  Result Received: 06/27/20 11:03   Cholesterol, Total 100 - 200 mg/dL 124   Triglyceride 35 - 199 mg/dL 66   HDL (  High Density Lipoprotein) Cholesterol 29.0 - 71.0 mg/dL 49.9   LDL Calculated 0 - 130 mg/dL 61   VLDL Cholesterol mg/dL 13   Cholesterol/HDL Ratio  2.5   [facility] - [facility]  Specimen Collected: 01/28/20 10:48 Last Resulted: 01/28/20 16:46  Received From: [facility]  Result Received: 06/27/20 11:03   Thyroid Stimulating Hormone (TSH) 0.450-5.330 uIU/ml uIU/mL 1.462   Resulting Agency  [facility] - [facility]  Specimen Collected: 01/28/20 10:48 Last Resulted: 01/28/20 18:59  Received From: [facility]  Result Received: 06/27/20 11:03   Urinalysis Component     Latest Ref Rng & Units 06/28/2020  Specific Gravity, UA     1.005 - 1.030 1.020  pH, UA     5.0 - 7.5 5.0  Color, UA     Yellow Yellow  Appearance Ur     Clear Clear  Leukocytes,UA     Negative Negative  Protein,UA     Negative/Trace Negative  Glucose, UA     Negative Negative  Ketones, UA     Negative Trace (A)  RBC, UA     Negative Negative  Bilirubin, UA     Negative Negative  Urobilinogen, Ur     0.2 - 1.0 mg/dL 1.0  Nitrite, UA     Negative Negative  Microscopic Examination      See below:   Component     Latest Ref Rng & Units 06/28/2020  WBC, UA     0 - 5 /hpf 0-5  RBC     0 - 2 /hpf 0-2  Epithelial Cells (non renal)     0 - 10 /hpf 0-10  Crystals     N/A Present (A)  Crystal Type     N/A Calcium Oxalate  Bacteria, UA     Shawn Stevens seen/Few Shawn Stevens seen  I have reviewed the labs.  Pertinent Imaging CLINICAL DATA:  Left-sided flank pain for 1 week  EXAM: ABDOMEN - 1 VIEW  COMPARISON:  Shawn Stevens.  FINDINGS: Scattered large and small bowel gas is noted. There is a 3-4  mm calcification identified in the expected region of the proximal left ureter. No definitive renal calculi are seen. No distal ureteral stones are noted. Diffuse degenerative changes of the lumbar spine are noted.  IMPRESSION: 3-4 mm calcification in the expected region of the proximal left ureter. CT urography may be helpful as clinically indicated.   Electronically Signed   By: Inez Catalina M.D.   On: 06/28/2020 11:39  CLINICAL DATA:  Left flank pain  EXAM: CT ABDOMEN AND PELVIS WITHOUT CONTRAST  TECHNIQUE: Multidetector CT imaging of the abdomen and pelvis was performed following the standard protocol without IV contrast.  COMPARISON:  Shawn Stevens.  FINDINGS: Lower chest: Lung bases are clear.  Hepatobiliary: Unenhanced liver is unremarkable.  Gallbladder is unremarkable. No intrahepatic or extrahepatic ductal dilatation.  Pancreas: Within normal limits.  Spleen: Within normal limits.  Adrenals/Urinary Tract: Adrenal glands are within normal limits.  Kidneys are within normal limits.  Mild left hydronephrosis.  4 mm proximal left ureteral calculus at the L2-3 level.  Bladder is within normal limits.  Stomach/Bowel: Stomach is within normal limits.  No evidence of bowel obstruction.  Normal appendix (series 2/image 64).  Vascular/Lymphatic: No evidence of abdominal aortic aneurysm.  Atherosclerotic calcifications of the abdominal aorta and branch vessels.  No suspicious abdominopelvic lymphadenopathy.  Reproductive: Prostate is unremarkable.  Other: No abdominopelvic ascites.  Musculoskeletal: Degenerative changes of  the visualized thoracolumbar spine.  IMPRESSION: 4 mm proximal left ureteral calculus at the L2-3 level. Mild left hydronephrosis.   Electronically Signed   By: Julian Hy M.D.   On: 06/28/2020 09:59 I have independently reviewed the films.  See HPI.   Assessment & Plan:    1. Left UVJ  stone -We discussed various treatment options for urolithiasis including observation with or without medical expulsive therapy, shockwave lithotripsy (SWL), ureteroscopy and laser lithotripsy with stent placement. -We discussed that management is based on stone size, location, density, patient co-morbidities, and patient preference.  -Stones <39m in size have a >80% spontaneous passage rate. Data surrounding the use of tamsulosin for medical expulsive therapy is controversial, but meta analyses suggests it is most efficacious for distal stones between 5-17min size. Possible side effects include dizziness/lightheadedness -SWL has a lower stone free rate in a single procedure, but also a lower complication rate compared to ureteroscopy and avoids a stent and associated stent related symptoms. Possible complications include renal hematoma, steinstrasse, and need for additional treatment. -Ureteroscopy with laser lithotripsy and stent placement has a higher stone free rate than SWL in a single procedure, however increased complication rate including possible infection, ureteral injury, bleeding, and stent related morbidity. Common stent related symptoms include dysuria, urgency/frequency, and flank pain. -Due to his daily aspirin, he is in eligible for ESWL this Thursday so he would like to pursue ureteroscopy as we can address his stone earlier than next week - schedule left ureteroscopy with laser lithotripsy and ureteral stent placement - explained to the patient how the procedure is performed and the risks involved - informed patient that they will have a stent placed during the procedure and will remain in place after the procedure for a short time.  - stent may be removed in the office with a cystoscope or patient may be instructed to remove the stent themselves by the string - described "stent pain" as feelings of needing to urinate/overactive bladder and a warm, tingling sensation to intense pain in  the affected flank - residual stones within the kidney or ureter may be present after the procedure and may need to have these addressed at a different encounter - injury to the ureter is the most common intra-operative risk, it may result in an open procedure to correct the defect - infection and bleeding are also risks - explained the risks of general anesthesia, such as: MI, CVA, paralysis, coma and/or death. - advised to contact our office or seek treatment in the ED if becomes febrile or pain/ vomiting are difficult control in order to arrange for emergent/urgent intervention -UA -urine culture   2. Left flank pain -see #1     Return for left URS/LL/ureteral stent placement.  SHZara CouncilPA-C  [facility] [address] [id number]

## 2020-06-27 NOTE — H&P (View-Only) (Signed)
06/28/2020 9:49 AM   Shawn Stevens Jun 28, 1950 [id number]  Referring provider: Sallee Lange, NP [address] [place],  [place] [address]  Chief Complaint  Patient presents with  . Nephrolithiasis   Urological history: 1. BPH with LU TS -PSA 0.16 in 12/2019  2. ED -contributing factors of age,  BPH, DM, HTN, HLD and sleep apnea  3. Sleep apnea -sleeps with CPAP  4. Nephrolithiasis -Passed a stone spontaneously in the 1980s  HPI: Shawn Stevens is a 70 y.o. male who presents today for possible kidney stone.  He states that last Tuesday morning he had the sudden onset of left-sided flank pain that radiated to his left waist that woke him up.  Since that time the pain has been intermittent.  He is having some nausea and dry heaves with the pain.  He denies fever, chills and gross hematuria.  He also is not having any other urinary symptoms such as dysuria.    His UA is positive for calcium oxalate crystals otherwise negative.  KUB taken yesterday demonstrates a 5 mm proximal left ureteral stone.   CT Renal stone study today confirms the stone and it is associated with hydronephrosis.   PMH: Past Medical History:  Diagnosis Date  . BPH (benign prostatic hypertrophy)   . Diabetes mellitus ([facility])   . ED (erectile dysfunction)   . GERD (gastroesophageal reflux disease)   . Heart disease   . HLD (hyperlipidemia)   . HTN (hypertension)   . Kidney stone   . Over weight   . Sleep apnea     Surgical History: Past Surgical History:  Procedure Laterality Date  . BACK SURGERY  2011    Home Medications:  Allergies as of 06/28/2020   No Known Allergies     Medication List       Accurate as of June 28, 2020  9:49 AM. If you have any questions, ask your nurse or doctor.        Aspirin Buf(CaCarb-MgCarb-MgO) 81 MG Tabs Take by mouth.   Cholecalciferol 25 MCG (1000 UT) tablet Take 1,000 Units by mouth daily.   ferrous sulfate 325 (65 FE) MG tablet Take  325 mg by mouth daily with breakfast.   glucose blood test strip Use 1 strip via meter twice daily as directed.   insulin NPH-regular Human (70-30) 100 UNIT/ML injection Inject 50 Units into the skin 2 (two) times daily with a meal.   INSULIN SYRINGE .5CC/31GX5/16" 31G X 5/16" 0.5 ML Misc as directed (twice daily insulin dosing.)   lisinopril 20 MG tablet Commonly known as: ZESTRIL Take 20 mg by mouth daily.   Magnesium 250 MG Tabs Take 1 tablet by mouth daily.   metFORMIN 1000 MG tablet Commonly known as: GLUCOPHAGE Take 1,000 mg by mouth 2 (two) times daily with a meal.   omeprazole 20 MG capsule Commonly known as: PRILOSEC Take 20 mg by mouth every other day.   pyridoxine 100 MG tablet Commonly known as: B-6 Take 100 mg by mouth daily.   simvastatin 10 MG tablet Commonly known as: ZOCOR Take 5 mg by mouth daily.   VITAMIN B-12 PO Take 500 mg by mouth daily.       Allergies: No Known Allergies  Family History: Family History  Problem Relation Age of Onset  . Pancreatic cancer Maternal Grandmother   . Heart disease Father   . Kidney disease Neg Hx   . Prostate cancer Neg Hx   . Bladder Cancer  Neg Hx     Social History:  reports that he has never smoked. He has never used smokeless tobacco. He reports that he does not drink alcohol and does not use drugs.  ROS: For pertinent review of systems please refer to history of present illness  Physical Exam: BP (!) 149/75   Pulse 73   Ht 5' 8"  (1.727 m)   Wt 225 lb (102.1 kg)   BMI 34.21 kg/m   Constitutional:  Well nourished. Alert and oriented, No acute distress. HEENT: [place] AT, mask in place.  Trachea midline Cardiovascular: No clubbing, cyanosis, or edema. Respiratory: Normal respiratory effort, no increased work of breathing. Neurologic: Grossly intact, no focal deficits, moving all 4 extremities. Psychiatric: Normal mood and affect.  Laboratory Data: WBC (White Blood Cell Count) 4.1 - 10.2 10^3/uL  8.5   RBC (Red Blood Cell Count) 4.69 - 6.13 10^6/uL 5.21   Hemoglobin 14.1 - 18.1 gm/dL 15.7   Hematocrit 40.0 - 52.0 % 47.4   MCV (Mean Corpuscular Volume) 80.0 - 100.0 fl 91.0   MCH (Mean Corpuscular Hemoglobin) 27.0 - 31.2 pg 30.1   MCHC (Mean Corpuscular Hemoglobin Concentration) 32.0 - 36.0 gm/dL 33.1   Platelet Count 150 - 450 10^3/uL 241   RDW-CV (Red Cell Distribution Width) 11.6 - 14.8 % 12.7   MPV (Mean Platelet Volume) 9.4 - 12.4 fl 9.0Low   Neutrophils 1.50 - 7.80 10^3/uL 5.60   Lymphocytes 1.00 - 3.60 10^3/uL 2.10   Mixed Count 0.10 - 0.90 10^3/uL 0.80   Neutrophil % 32.0 - 70.0 % 65.7   Lymphocyte % 10.0 - 50.0 % 25.2   Mixed % 3.0 - 14.4 % 9.1   Resulting Agency  [facility]  Specimen Collected: 01/28/20 10:48 Last Resulted: 01/28/20 11:24  Received From: [facility]  Result Received: 06/27/20 11:03   Glucose 70 - 110 mg/dL 94   Sodium 136 - 145 mmol/L 139   Potassium 3.6 - 5.1 mmol/L 5.1   Chloride 97 - 109 mmol/L 104   Carbon Dioxide (CO2) 22.0 - 32.0 mmol/L 30.8   Urea Nitrogen (BUN) 7 - 25 mg/dL 23   Creatinine 0.7 - 1.3 mg/dL 0.9   Glomerular Filtration Rate (eGFR), MDRD Estimate >60 mL/min/1.73sq m 84   Calcium 8.7 - 10.3 mg/dL 9.6   AST  8 - 39 U/L 26   ALT  6 - 57 U/L 29   Alk Phos (alkaline Phosphatase) 34 - 104 U/L 45   Albumin 3.5 - 4.8 g/dL 4.4   Bilirubin, Total 0.3 - 1.2 mg/dL 0.7   Protein, Total 6.1 - 7.9 g/dL 6.8   A/G Ratio 1.0 - 5.0 gm/dL 1.8   Resulting Agency  [facility]  Specimen Collected: 01/28/20 10:48 Last Resulted: 01/28/20 16:47  Received From: [facility]  Result Received: 06/27/20 11:03   Hemoglobin A1C 4.2 - 5.6 % 6.4High   Average Blood Glucose (Calc) mg/dL 137   Resulting Agency  [facility]   Narrative Performed by [facility] Normal Range:  4.2 - 5.6%  Increased Risk: 5.7 - 6.4%  Diabetes:    >= 6.5%  Glycemic  Control for adults with diabetes: <7%  Specimen Collected: 01/28/20 10:48 Last Resulted: 01/28/20 17:31  Received From: [facility]  Result Received: 06/27/20 11:03   Cholesterol, Total 100 - 200 mg/dL 124   Triglyceride 35 - 199 mg/dL 66   HDL (  High Density Lipoprotein) Cholesterol 29.0 - 71.0 mg/dL 49.9   LDL Calculated 0 - 130 mg/dL 61   VLDL Cholesterol mg/dL 13   Cholesterol/HDL Ratio  2.5   [facility]  Specimen Collected: 01/28/20 10:48 Last Resulted: 01/28/20 16:46  Received From: [facility]  Result Received: 06/27/20 11:03   Thyroid Stimulating Hormone (TSH) 0.450-5.330 uIU/ml uIU/mL 1.462   Resulting Agency  [facility] - [facility]  Specimen Collected: 01/28/20 10:48 Last Resulted: 01/28/20 18:59  Received From: [facility]  Result Received: 06/27/20 11:03   Urinalysis Component     Latest Ref Rng & Units 06/28/2020  Specific Gravity, UA     1.005 - 1.030 1.020  pH, UA     5.0 - 7.5 5.0  Color, UA     Yellow Yellow  Appearance Ur     Clear Clear  Leukocytes,UA     Negative Negative  Protein,UA     Negative/Trace Negative  Glucose, UA     Negative Negative  Ketones, UA     Negative Trace (A)  RBC, UA     Negative Negative  Bilirubin, UA     Negative Negative  Urobilinogen, Ur     0.2 - 1.0 mg/dL 1.0  Nitrite, UA     Negative Negative  Microscopic Examination      See below:   Component     Latest Ref Rng & Units 06/28/2020  WBC, UA     0 - 5 /hpf 0-5  RBC     0 - 2 /hpf 0-2  Epithelial Cells (non renal)     0 - 10 /hpf 0-10  Crystals     N/A Present (A)  Crystal Type     N/A Calcium Oxalate  Bacteria, UA     None seen/Few None seen  I have reviewed the labs.  Pertinent Imaging CLINICAL DATA:  Left-sided flank pain for 1 week  EXAM: ABDOMEN - 1 VIEW  COMPARISON:  None.  FINDINGS: Scattered large and small bowel gas is noted. There is a 3-4  mm calcification identified in the expected region of the proximal left ureter. No definitive renal calculi are seen. No distal ureteral stones are noted. Diffuse degenerative changes of the lumbar spine are noted.  IMPRESSION: 3-4 mm calcification in the expected region of the proximal left ureter. CT urography may be helpful as clinically indicated.   Electronically Signed   By: Inez Catalina M.D.   On: 06/28/2020 11:39  CLINICAL DATA:  Left flank pain  EXAM: CT ABDOMEN AND PELVIS WITHOUT CONTRAST  TECHNIQUE: Multidetector CT imaging of the abdomen and pelvis was performed following the standard protocol without IV contrast.  COMPARISON:  None.  FINDINGS: Lower chest: Lung bases are clear.  Hepatobiliary: Unenhanced liver is unremarkable.  Gallbladder is unremarkable. No intrahepatic or extrahepatic ductal dilatation.  Pancreas: Within normal limits.  Spleen: Within normal limits.  Adrenals/Urinary Tract: Adrenal glands are within normal limits.  Kidneys are within normal limits.  Mild left hydronephrosis.  4 mm proximal left ureteral calculus at the L2-3 level.  Bladder is within normal limits.  Stomach/Bowel: Stomach is within normal limits.  No evidence of bowel obstruction.  Normal appendix (series 2/image 64).  Vascular/Lymphatic: No evidence of abdominal aortic aneurysm.  Atherosclerotic calcifications of the abdominal aorta and branch vessels.  No suspicious abdominopelvic lymphadenopathy.  Reproductive: Prostate is unremarkable.  Other: No abdominopelvic ascites.  Musculoskeletal: Degenerative changes of  the visualized thoracolumbar spine.  IMPRESSION: 4 mm proximal left ureteral calculus at the L2-3 level. Mild left hydronephrosis.   Electronically Signed   By: Julian Hy M.D.   On: 06/28/2020 09:59 I have independently reviewed the films.  See HPI.   Assessment & Plan:    1. Left UVJ  stone -We discussed various treatment options for urolithiasis including observation with or without medical expulsive therapy, shockwave lithotripsy (SWL), ureteroscopy and laser lithotripsy with stent placement. -We discussed that management is based on stone size, location, density, patient co-morbidities, and patient preference.  -Stones <56m in size have a >80% spontaneous passage rate. Data surrounding the use of tamsulosin for medical expulsive therapy is controversial, but meta analyses suggests it is most efficacious for distal stones between 5-174min size. Possible side effects include dizziness/lightheadedness -SWL has a lower stone free rate in a single procedure, but also a lower complication rate compared to ureteroscopy and avoids a stent and associated stent related symptoms. Possible complications include renal hematoma, steinstrasse, and need for additional treatment. -Ureteroscopy with laser lithotripsy and stent placement has a higher stone free rate than SWL in a single procedure, however increased complication rate including possible infection, ureteral injury, bleeding, and stent related morbidity. Common stent related symptoms include dysuria, urgency/frequency, and flank pain. -Due to his daily aspirin, he is in eligible for ESWL this Thursday so he would like to pursue ureteroscopy as we can address his stone earlier than next week - schedule left ureteroscopy with laser lithotripsy and ureteral stent placement - explained to the patient how the procedure is performed and the risks involved - informed patient that they will have a stent placed during the procedure and will remain in place after the procedure for a short time.  - stent may be removed in the office with a cystoscope or patient may be instructed to remove the stent themselves by the string - described "stent pain" as feelings of needing to urinate/overactive bladder and a warm, tingling sensation to intense pain in  the affected flank - residual stones within the kidney or ureter may be present after the procedure and may need to have these addressed at a different encounter - injury to the ureter is the most common intra-operative risk, it may result in an open procedure to correct the defect - infection and bleeding are also risks - explained the risks of general anesthesia, such as: MI, CVA, paralysis, coma and/or death. - advised to contact our office or seek treatment in the ED if becomes febrile or pain/ vomiting are difficult control in order to arrange for emergent/urgent intervention -UA -urine culture   2. Left flank pain -see #1     Return for left URS/LL/ureteral stent placement.  SHZara CouncilPA-C  [facility] [id number] [phone]

## 2020-06-27 NOTE — Addendum Note (Signed)
Addended by: Sueanne Margarita on: 06/27/2020 11:11 AM   Modules accepted: Orders

## 2020-06-28 ENCOUNTER — Encounter: Payer: Self-pay | Admitting: Urology

## 2020-06-28 ENCOUNTER — Other Ambulatory Visit: Payer: Self-pay

## 2020-06-28 ENCOUNTER — Ambulatory Visit (INDEPENDENT_AMBULATORY_CARE_PROVIDER_SITE_OTHER): Payer: Medicare Other | Admitting: Urology

## 2020-06-28 ENCOUNTER — Ambulatory Visit
Admission: RE | Admit: 2020-06-28 | Discharge: 2020-06-28 | Disposition: A | Discharge status: Home / Self Care | Payer: Medicare Other | Source: Ambulatory Visit | Attending: Urology | Admitting: Urology

## 2020-06-28 ENCOUNTER — Other Ambulatory Visit: Payer: Self-pay | Admitting: Urology

## 2020-06-28 VITALS — BP 149/75 | HR 73 | Ht 68.0 in | Wt 225.0 lb

## 2020-06-28 DIAGNOSIS — N200 Calculus of kidney: Secondary | ICD-10-CM

## 2020-06-28 DIAGNOSIS — R109 Unspecified abdominal pain: Secondary | ICD-10-CM | POA: Diagnosis present

## 2020-06-28 LAB — URINALYSIS, COMPLETE
Bilirubin, UA: NEGATIVE
Glucose, UA: NEGATIVE
Leukocytes,UA: NEGATIVE
Nitrite, UA: NEGATIVE
Protein,UA: NEGATIVE
RBC, UA: NEGATIVE
Specific Gravity, UA: 1.02 (ref 1.005–1.030)
Urobilinogen, Ur: 1 mg/dL (ref 0.2–1.0)
pH, UA: 5 (ref 5.0–7.5)

## 2020-06-28 LAB — MICROSCOPIC EXAMINATION: Bacteria, UA: NONE SEEN

## 2020-06-29 ENCOUNTER — Other Ambulatory Visit
Admission: RE | Admit: 2020-06-29 | Discharge: 2020-06-29 | Disposition: A | Discharge status: Home / Self Care | Payer: Medicare Other | Source: Ambulatory Visit | Attending: Urology | Admitting: Urology

## 2020-06-29 HISTORY — DX: Anemia, unspecified: D64.9

## 2020-06-29 NOTE — Patient Instructions (Addendum)
Your procedure is scheduled on: Friday July 01, 2020. Report to Day Surgery inside [facility] 2nd floor (Stop by admissions desk first before getting on elevator). To find out your arrival time please call [phone] between 1PM - 3PM on Thursday June 30, 2020.  Remember: Instructions that are not followed completely may result in serious medical risk,  up to and including death, or upon the discretion of your surgeon and anesthesiologist your  surgery may need to be rescheduled.     _X__ 1. Do not eat food or drink fluids after midnight the night before your procedure.                 No chewing gum or hard candies.   __X__2.  On the morning of surgery brush your teeth with toothpaste and water, you                may rinse your mouth with mouthwash if you wish.  Do not swallow any toothpaste of mouthwash.     _X__ 3.  No Alcohol for 24 hours before or after surgery.   _X__ 4.  Do Not Smoke or use e-cigarettes For 24 Hours Prior to Your Surgery.                 Do not use any chewable tobacco products for at least 6 hours prior to                 Surgery.  _X__  5.  Do not use any recreational drugs (marijuana, cocaine, heroin, ecstasy, MDMA or other)                For at least one week prior to your surgery.  Combination of these drugs with anesthesia                May have life threatening results.  __X__ 6.  Notify your doctor if there is any change in your medical condition      (cold, fever, infections).     Do not wear jewelry, make-up, hairpins, clips or nail polish. Do not wear lotions, powders, or perfumes. You may wear deodorant. Do not shave 48 hours prior to surgery. Men may shave face and neck. Do not bring valuables to the hospital.    [facility] is not responsible for any belongings or valuables.  Contacts, dentures or bridgework may not be worn into surgery. Leave your suitcase in the car. After surgery it may be brought to your  room. For patients admitted to the hospital, discharge time is determined by your treatment team.   Patients discharged the day of surgery will not be allowed to drive home.   Make arrangements for someone to be with you for the first 24 hours of your Same Day Discharge.   __X__ Take these medicines the morning of surgery with A SIP OF WATER:    1. omeprazole (PRILOSEC) 20 MG  2.   3.   4.  5.  6.  ____ Fleet Enema (as directed)   ____ Use CHG Soap (or wipes) as directed  ____ Use Benzoyl Peroxide Gel as instructed  ____ Use inhalers on the day of surgery  __X__ Stop metFORMIN (GLUCOPHAGE) 1000 2 days prior to surgery (no more today or tomorrow until after your procedure)    __X__ Take 1/2 of usual insulin dose the night before surgery (Thursday night).   NO insulin the morning of surgery. insulin NPH-regular Human (NOVOLIN 70/30) (  70-30) 100 UNIT/ML injection  ____ Call your PCP, cardiologist, or Pulmonologist if taking Coumadin/Plavix/aspirin and ask when to stop before your surgery.   __X__ One Week prior to surgery- Stop Anti-inflammatories such as Ibuprofen, Aleve, Advil, Motrin, meloxicam (MOBIC), diclofenac, etodolac, ketorolac, Toradol, Daypro, piroxicam, Goody's or BC powders. OK TO USE TYLENOL IF NEEDED   __X__ Stop All supplements until after surgery.  Cholecalciferol 25 MCG, ferrous sulfate 325, Magnesium 250 MG,pyridoxine (B-6) andvitamin B-12    ____ Bring C-Pap to the hospital.    If you have any questions regarding your pre-procedure instructions,  Please call Pre-admit Testing at [phone].

## 2020-06-30 ENCOUNTER — Other Ambulatory Visit
Admission: RE | Admit: 2020-06-30 | Discharge: 2020-06-30 | Disposition: A | Discharge status: Home / Self Care | Payer: Medicare Other | Source: Ambulatory Visit | Attending: Urology | Admitting: Urology

## 2020-06-30 ENCOUNTER — Other Ambulatory Visit: Payer: Self-pay

## 2020-06-30 DIAGNOSIS — I4510 Unspecified right bundle-branch block: Secondary | ICD-10-CM | POA: Diagnosis not present

## 2020-06-30 DIAGNOSIS — Z01818 Encounter for other preprocedural examination: Secondary | ICD-10-CM | POA: Diagnosis present

## 2020-06-30 DIAGNOSIS — I452 Bifascicular block: Secondary | ICD-10-CM | POA: Diagnosis not present

## 2020-06-30 DIAGNOSIS — R001 Bradycardia, unspecified: Secondary | ICD-10-CM | POA: Diagnosis not present

## 2020-06-30 DIAGNOSIS — I10 Essential (primary) hypertension: Secondary | ICD-10-CM | POA: Diagnosis not present

## 2020-06-30 LAB — CBC
HCT: 44.6 % (ref 39.0–52.0)
Hemoglobin: 15.2 g/dL (ref 13.0–17.0)
MCH: 30.3 pg (ref 26.0–34.0)
MCHC: 34.1 g/dL (ref 30.0–36.0)
MCV: 89 fL (ref 80.0–100.0)
Platelets: 234 10*3/uL (ref 150–400)
RBC: 5.01 MIL/uL (ref 4.22–5.81)
RDW: 12.8 % (ref 11.5–15.5)
WBC: 7.3 10*3/uL (ref 4.0–10.5)
nRBC: 0 % (ref 0.0–0.2)

## 2020-06-30 LAB — CULTURE, URINE COMPREHENSIVE

## 2020-06-30 LAB — BASIC METABOLIC PANEL
Anion gap: 9 (ref 5–15)
BUN: 17 mg/dL (ref 8–23)
CO2: 26 mmol/L (ref 22–32)
Calcium: 9.3 mg/dL (ref 8.9–10.3)
Chloride: 103 mmol/L (ref 98–111)
Creatinine, Ser: 0.86 mg/dL (ref 0.61–1.24)
GFR, Estimated: >60 mL/min (ref >60)
Glucose, Bld: 228 mg/dL — ABNORMAL HIGH (ref 70–99)
Potassium: 4.1 mmol/L (ref 3.5–5.1)
Sodium: 138 mmol/L (ref 135–145)

## 2020-06-30 MED ORDER — ORAL CARE MOUTH RINSE: 15.0000 mL | Freq: Once | OROMUCOSAL | Status: AC

## 2020-06-30 MED ORDER — SODIUM CHLORIDE 0.9 % IV SOLN: INTRAVENOUS | Status: DC

## 2020-06-30 MED ORDER — CHLORHEXIDINE GLUCONATE 0.12 % MT SOLN: 15.0000 mL | Freq: Once | OROMUCOSAL | Status: AC

## 2020-06-30 MED ORDER — CEFAZOLIN SODIUM-DEXTROSE 2-4 GM/100ML-% IV SOLN
2.0000 g | INTRAVENOUS | Status: AC
  Administered 2020-07-01: 2 g via INTRAVENOUS

## 2020-07-01 ENCOUNTER — Encounter
Admission: RE | Disposition: A | Discharge status: Home / Self Care | Payer: Self-pay | Source: Home / Self Care | Attending: Urology

## 2020-07-01 ENCOUNTER — Ambulatory Visit: Payer: Medicare Other | Admitting: Certified Registered Nurse Anesthetist

## 2020-07-01 ENCOUNTER — Ambulatory Visit: Payer: Medicare Other

## 2020-07-01 ENCOUNTER — Encounter: Payer: Self-pay | Admitting: Urology

## 2020-07-01 ENCOUNTER — Other Ambulatory Visit: Payer: Self-pay

## 2020-07-01 ENCOUNTER — Ambulatory Visit
Admission: RE | Admit: 2020-07-01 | Discharge: 2020-07-01 | Disposition: A | Discharge status: Home / Self Care | Payer: Medicare Other | Attending: Urology | Admitting: Urology

## 2020-07-01 DIAGNOSIS — I10 Essential (primary) hypertension: Secondary | ICD-10-CM | POA: Insufficient documentation

## 2020-07-01 DIAGNOSIS — Z800 Family history of malignant neoplasm of digestive organs: Secondary | ICD-10-CM | POA: Insufficient documentation

## 2020-07-01 DIAGNOSIS — Z7984 Long term (current) use of oral hypoglycemic drugs: Secondary | ICD-10-CM | POA: Insufficient documentation

## 2020-07-01 DIAGNOSIS — K219 Gastro-esophageal reflux disease without esophagitis: Secondary | ICD-10-CM | POA: Diagnosis not present

## 2020-07-01 DIAGNOSIS — Z8249 Family history of ischemic heart disease and other diseases of the circulatory system: Secondary | ICD-10-CM | POA: Diagnosis not present

## 2020-07-01 DIAGNOSIS — N201 Calculus of ureter: Secondary | ICD-10-CM | POA: Diagnosis present

## 2020-07-01 DIAGNOSIS — Z87442 Personal history of urinary calculi: Secondary | ICD-10-CM | POA: Diagnosis not present

## 2020-07-01 DIAGNOSIS — N132 Hydronephrosis with renal and ureteral calculous obstruction: Secondary | ICD-10-CM | POA: Insufficient documentation

## 2020-07-01 DIAGNOSIS — Z79899 Other long term (current) drug therapy: Secondary | ICD-10-CM | POA: Insufficient documentation

## 2020-07-01 DIAGNOSIS — N200 Calculus of kidney: Secondary | ICD-10-CM

## 2020-07-01 DIAGNOSIS — G4730 Sleep apnea, unspecified: Secondary | ICD-10-CM | POA: Insufficient documentation

## 2020-07-01 DIAGNOSIS — Z794 Long term (current) use of insulin: Secondary | ICD-10-CM | POA: Insufficient documentation

## 2020-07-01 DIAGNOSIS — E119 Type 2 diabetes mellitus without complications: Secondary | ICD-10-CM | POA: Insufficient documentation

## 2020-07-01 HISTORY — PX: CYSTOSCOPY/URETEROSCOPY/HOLMIUM LASER/STENT PLACEMENT: SHX6546

## 2020-07-01 LAB — GLUCOSE, CAPILLARY
Glucose-Capillary: 158 mg/dL — ABNORMAL HIGH (ref 70–99)
Glucose-Capillary: 172 mg/dL — ABNORMAL HIGH (ref 70–99)

## 2020-07-01 SURGERY — CYSTOSCOPY/URETEROSCOPY/HOLMIUM LASER/STENT PLACEMENT
Anesthesia: General | Laterality: Left

## 2020-07-01 MED ORDER — FENTANYL CITRATE (PF) 100 MCG/2ML IJ SOLN: 25.0000 ug | INTRAMUSCULAR | Status: DC | PRN

## 2020-07-01 MED ORDER — FENTANYL CITRATE (PF) 100 MCG/2ML IJ SOLN
INTRAMUSCULAR | Status: AC
  Filled 2020-07-01: qty 2

## 2020-07-01 MED ORDER — CEFAZOLIN SODIUM-DEXTROSE 2-4 GM/100ML-% IV SOLN
INTRAVENOUS | Status: AC
  Filled 2020-07-01: qty 100

## 2020-07-01 MED ORDER — SUGAMMADEX SODIUM 200 MG/2ML IV SOLN
INTRAVENOUS | Status: DC | PRN
  Administered 2020-07-01: 200 mg via INTRAVENOUS

## 2020-07-01 MED ORDER — IOHEXOL 180 MG/ML  SOLN
INTRAMUSCULAR | Status: DC | PRN
  Administered 2020-07-01: 5 mL

## 2020-07-01 MED ORDER — BELLADONNA ALKALOIDS-OPIUM 16.2-60 MG RE SUPP
RECTAL | Status: AC
  Filled 2020-07-01: qty 1

## 2020-07-01 MED ORDER — ONDANSETRON HCL 4 MG/2ML IJ SOLN: 4.0000 mg | Freq: Once | INTRAMUSCULAR | Status: DC | PRN

## 2020-07-01 MED ORDER — HYDROCODONE-ACETAMINOPHEN 5-325 MG PO TABS: 1.0000 | ORAL_TABLET | ORAL | 0 refills | Status: AC | PRN

## 2020-07-01 MED ORDER — ESMOLOL HCL 100 MG/10ML IV SOLN
INTRAVENOUS | Status: AC
  Filled 2020-07-01: qty 10

## 2020-07-01 MED ORDER — OXYCODONE HCL 5 MG/5ML PO SOLN: 5.0000 mg | Freq: Once | ORAL | Status: DC | PRN

## 2020-07-01 MED ORDER — PROPOFOL 10 MG/ML IV BOLUS
INTRAVENOUS | Status: DC | PRN
  Administered 2020-07-01: 160 mg via INTRAVENOUS

## 2020-07-01 MED ORDER — BELLADONNA ALKALOIDS-OPIUM 16.2-60 MG RE SUPP
RECTAL | Status: DC | PRN
  Administered 2020-07-01: 1 via RECTAL

## 2020-07-01 MED ORDER — KETOROLAC TROMETHAMINE 30 MG/ML IJ SOLN
INTRAMUSCULAR | Status: AC
  Filled 2020-07-01: qty 1

## 2020-07-01 MED ORDER — LIDOCAINE HCL (CARDIAC) PF 100 MG/5ML IV SOSY
PREFILLED_SYRINGE | INTRAVENOUS | Status: DC | PRN
  Administered 2020-07-01: 100 mg via INTRAVENOUS

## 2020-07-01 MED ORDER — OXYCODONE HCL 5 MG PO TABS: 5.0000 mg | ORAL_TABLET | Freq: Once | ORAL | Status: DC | PRN

## 2020-07-01 MED ORDER — FENTANYL CITRATE (PF) 100 MCG/2ML IJ SOLN
INTRAMUSCULAR | Status: DC | PRN
  Administered 2020-07-01 (×2): 50 ug via INTRAVENOUS

## 2020-07-01 MED ORDER — ROCURONIUM BROMIDE 100 MG/10ML IV SOLN
INTRAVENOUS | Status: DC | PRN
  Administered 2020-07-01: 50 mg via INTRAVENOUS

## 2020-07-01 MED ORDER — CHLORHEXIDINE GLUCONATE 0.12 % MT SOLN
OROMUCOSAL | Status: AC
  Administered 2020-07-01: 15 mL via OROMUCOSAL
  Filled 2020-07-01: qty 15

## 2020-07-01 MED ORDER — ESMOLOL HCL 100 MG/10ML IV SOLN
INTRAVENOUS | Status: DC | PRN
  Administered 2020-07-01: 20 mg via INTRAVENOUS

## 2020-07-01 MED ORDER — ONDANSETRON HCL 4 MG/2ML IJ SOLN
INTRAMUSCULAR | Status: DC | PRN
  Administered 2020-07-01: 4 mg via INTRAVENOUS

## 2020-07-01 MED ORDER — ACETAMINOPHEN 10 MG/ML IV SOLN: 1000.0000 mg | Freq: Once | INTRAVENOUS | Status: DC | PRN

## 2020-07-01 SURGICAL SUPPLY — 30 items
BAG DRAIN CYSTO-URO LG1000N (MISCELLANEOUS) ×3 IMPLANT
BRUSH SCRUB EZ 1% IODOPHOR (MISCELLANEOUS) ×3 IMPLANT
CATH URET FLEX-TIP 2 LUMEN 10F (CATHETERS) ×3 IMPLANT
CATH URETL 5X70 OPEN END (CATHETERS) IMPLANT
CNTNR SPEC 2.5X3XGRAD LEK (MISCELLANEOUS)
CONT SPEC 4OZ STER OR WHT (MISCELLANEOUS)
CONT SPEC 4OZ STRL OR WHT (MISCELLANEOUS)
CONTAINER SPEC 2.5X3XGRAD LEK (MISCELLANEOUS) IMPLANT
DRAPE UTILITY 15X26 TOWEL STRL (DRAPES) ×3 IMPLANT
DRSG TEGADERM 2-3/8X2-3/4 SM (GAUZE/BANDAGES/DRESSINGS) ×3 IMPLANT
GLOVE SURG UNDER POLY LF SZ7.5 (GLOVE) ×3 IMPLANT
GOWN STRL REUS W/ TWL LRG LVL3 (GOWN DISPOSABLE) ×1 IMPLANT
GOWN STRL REUS W/ TWL XL LVL3 (GOWN DISPOSABLE) ×1 IMPLANT
GOWN STRL REUS W/TWL LRG LVL3 (GOWN DISPOSABLE) ×3
GOWN STRL REUS W/TWL XL LVL3 (GOWN DISPOSABLE) ×3
GUIDEWIRE GREEN .038 145CM (MISCELLANEOUS) ×3 IMPLANT
GUIDEWIRE STR DUAL SENSOR (WIRE) ×6 IMPLANT
INFUSOR MANOMETER BAG 3000ML (MISCELLANEOUS) ×3 IMPLANT
IV NS IRRIG 3000ML ARTHROMATIC (IV SOLUTION) ×3 IMPLANT
KIT TURNOVER CYSTO (KITS) ×3 IMPLANT
PACK CYSTO AR (MISCELLANEOUS) ×3 IMPLANT
SET CYSTO W/LG BORE CLAMP LF (SET/KITS/TRAYS/PACK) ×3 IMPLANT
SHEATH URETERAL 12FRX35CM (MISCELLANEOUS) IMPLANT
STENT URET 6FRX24 CONTOUR (STENTS) IMPLANT
STENT URET 6FRX26 CONTOUR (STENTS) ×3 IMPLANT
SURGILUBE 2OZ TUBE FLIPTOP (MISCELLANEOUS) ×3 IMPLANT
SYR 10ML LL (SYRINGE) ×3 IMPLANT
TRACTIP FLEXIVA PULSE ID 200 (Laser) ×3 IMPLANT
VALVE UROSEAL ADJ ENDO (VALVE) IMPLANT
WATER STERILE IRR 1000ML POUR (IV SOLUTION) ×3 IMPLANT

## 2020-07-01 NOTE — Op Note (Signed)
Date of procedure: 07/01/20  Preoperative diagnosis:  Left proximal ureteral stone  Postoperative diagnosis:  Same  Procedure: Cystoscopy, left ureteroscopy, laser lithotripsy, left retrograde pyelogram with intraoperative interpretation, left ureteral stent placement  Surgeon: Nickolas Madrid, MD  Anesthesia: General  Complications: None  Intraoperative findings:  Normal cystoscopy, uncomplicated dusting of impacted left proximal ureteral stone, very tight ureter Uncomplicated left ureteral stent placement  EBL: Minimal  Specimens: None  Drains: Left 6 French by 26 cm ureteral stent  Indication: Shawn Stevens is a 70 y.o. patient with 5 mm left proximal ureteral stone and ongoing renal coli.  After reviewing the management options for treatment, they elected to proceed with the above surgical procedure(s). We have discussed the potential benefits and risks of the procedure, side effects of the proposed treatment, the likelihood of the patient achieving the goals of the procedure, and any potential problems that might occur during the procedure or recuperation. Informed consent has been obtained.  Description of procedure:  The patient was taken to the operating room and general anesthesia was induced. SCDs were placed for DVT prophylaxis. The patient was placed in the dorsal lithotomy position, prepped and draped in the usual sterile fashion, and preoperative antibiotics(Ancef) were administered. A preoperative time-out was performed.   A 21 French rigid cystoscope was used to intubate the urethra and a normal-appearing urethra was followed proximally to the bladder.  The prostate was moderate in size.  Thorough cystoscopy showed normal mucosa throughout with no suspicious lesions, and the ureteral orifices were orthotopic bilaterally.  A sensor wire advanced easily into the left ureteral orifice up to the kidney under fluoroscopic vision.  I started by advancing a semirigid  ureteroscope alongside the wire and this was followed up to the proximal ureter with no evidence of stone.  The ureter was very tight.  A dual-lumen ureteral access catheter was advanced over the wire, and a second safety wire was added.  I attempted to pass the single channel digital flexible ureteroscope but this met resistance in the distal ureter.  One of the sensor wires was exchanged for Super Stiff wire, and I was then able to navigate the single channel digital flexible scope up to the level of the stone in the proximal ureter.  The ureter was extremely tight just below the stone.  A 242 m laser fiber was advanced through the scope and the stone was dusted to <1 mm fragments.  No fragments appear to retropulsed up into the kidney.  I was unable to advance the ureteroscope through the tight proximal ureter into the kidney.    A retrograde pyelogram was performed from the proximal ureter which showed no extravasation or filling defects.  Careful pullback ureteroscopy showed no ureteral injury or residual stones.  The rigid cystoscope was backloaded over the wire and a 6 [place] by 26 cm ureteral stent was uneventfully placed with an excellent curl in the renal pelvis, as well as under direct vision the bladder.  Contrast was seen to drain through the side ports of the stent.  The bladder was drained and a belladonna suppository was placed.   Disposition: Stable to PACU  Plan: Stent removal in 1 to 2 weeks Will need renal ultrasound 4 to 6 weeks after to rule out silent hydronephrosis  Nickolas Madrid, MD

## 2020-07-01 NOTE — Interval H&P Note (Signed)
UROLOGY H&P UPDATE  Agree with prior H&P dated 06/28/2020 by Michiel Cowboy, PA.  70 year old male with 5 mm left proximal ureteral stone and renal colic.  Cardiac: RRR Lungs: CTA bilaterally  Laterality: Left Procedure: Left ureteroscopy, laser lithotripsy, stent placement  Urine: Culture 6/7 10-25k mixed flora  We specifically discussed the risks ureteroscopy including bleeding, infection/sepsis, stent related symptoms including flank pain/urgency/frequency/incontinence/dysuria, ureteral injury, inability to access stone, or need for staged or additional procedures.   Sondra Come, MD 07/01/2020

## 2020-07-01 NOTE — Discharge Instructions (Signed)

## 2020-07-01 NOTE — Anesthesia Preprocedure Evaluation (Signed)
Anesthesia Evaluation  Patient identified by MRN, date of birth, ID band Patient awake    Reviewed: Allergy & Precautions, NPO status , Patient's Chart, lab work & pertinent test results  History of Anesthesia Complications Negative for: history of anesthetic complications  Airway Mallampati: III  TM Distance: >3 FB Neck ROM: Full    Dental no notable dental hx. (+) Teeth Intact   Pulmonary sleep apnea and Continuous Positive Airway Pressure Ventilation , neg COPD, Patient abstained from smoking.Not current smoker,    Pulmonary exam normal breath sounds clear to auscultation       Cardiovascular Exercise Tolerance: Good METShypertension, (-) CAD and (-) Past MI (-) dysrhythmias  Rhythm:Regular Rate:Normal - Systolic murmurs    Neuro/Psych negative neurological ROS  negative psych ROS   GI/Hepatic GERD  ,(+)     (-) substance abuse  ,   Endo/Other  diabetes, Insulin Dependent  Renal/GU negative Renal ROS     Musculoskeletal   Abdominal   Peds  Hematology   Anesthesia Other Findings Past Medical History: No date: Anemia No date: BPH (benign prostatic hypertrophy) No date: Diabetes mellitus ([facility]) No date: ED (erectile dysfunction) No date: GERD (gastroesophageal reflux disease) No date: Heart disease No date: HLD (hyperlipidemia) No date: HTN (hypertension) No date: Kidney stone No date: Over weight No date: Sleep apnea     Comment:  uses cpap  Reproductive/Obstetrics                             Anesthesia Physical Anesthesia Plan  ASA: 3  Anesthesia Plan: General   Post-op Pain Management:    Induction: Intravenous  PONV Risk Score and Plan: 3 and Ondansetron and Dexamethasone  Airway Management Planned: Oral ETT  Additional Equipment: None  Intra-op Plan:   Post-operative Plan: Extubation in OR  Informed Consent: I have reviewed the patients History and Physical,  chart, labs and discussed the procedure including the risks, benefits and alternatives for the proposed anesthesia with the patient or authorized representative who has indicated his/her understanding and acceptance.     Dental advisory given  Plan Discussed with: CRNA and Surgeon  Anesthesia Plan Comments: (Discussed risks of anesthesia with patient, including PONV, sore throat, lip/dental damage. Rare risks discussed as well, such as cardiorespiratory and neurological sequelae. Patient understands.)        Anesthesia Quick Evaluation

## 2020-07-01 NOTE — Anesthesia Procedure Notes (Addendum)
Procedure Name: Intubation Date/Time: 07/01/2020 11:28 AM Performed by: Henrietta Hoover, CRNA Pre-anesthesia Checklist: Patient identified, Emergency Drugs available, Suction available and Patient being monitored Patient Re-evaluated:Patient Re-evaluated prior to induction Oxygen Delivery Method: Circle system utilized Preoxygenation: Pre-oxygenation with 100% oxygen Induction Type: IV induction Ventilation: Mask ventilation without difficulty Laryngoscope Size: McGraph and 4 Grade View: Grade I Tube type: Oral Number of attempts: 1 Airway Equipment and Method: Stylet and Video-laryngoscopy (Electively used videoscope) Placement Confirmation: ETT inserted through vocal cords under direct vision, positive ETCO2 and breath sounds checked- equal and bilateral Secured at: 22 cm Tube secured with: Tape Dental Injury: Teeth and Oropharynx as per pre-operative assessment

## 2020-07-01 NOTE — Anesthesia Postprocedure Evaluation (Signed)
Anesthesia Post Note  Patient: Shawn Stevens  Procedure(s) Performed: CYSTOSCOPY/URETEROSCOPY/HOLMIUM LASER/STENT PLACEMENT (Left)  Patient location during evaluation: PACU Anesthesia Type: General Level of consciousness: awake and alert Pain management: pain level controlled Vital Signs Assessment: post-procedure vital signs reviewed and stable Respiratory status: spontaneous breathing, nonlabored ventilation, respiratory function stable and patient connected to nasal cannula oxygen Cardiovascular status: blood pressure returned to baseline and stable Postop Assessment: no apparent nausea or vomiting Anesthetic complications: no   No notable events documented.   Last Vitals:  Vitals:   07/01/20 1245 07/01/20 1300  BP: (!) 147/77 (!) 142/66  Pulse: 60 (!) 59  Resp: 17 16  Temp: 36.7 C (!) 36 C  SpO2: 97% 99%    Last Pain:  Vitals:   07/01/20 1300  TempSrc: Temporal  PainSc: 0-No pain                 Corinda Gubler

## 2020-07-01 NOTE — Transfer of Care (Signed)
Immediate Anesthesia Transfer of Care Note  Patient: Shawn Stevens  Procedure(s) Performed: CYSTOSCOPY/URETEROSCOPY/HOLMIUM LASER/STENT PLACEMENT (Left)  Patient Location: PACU  Anesthesia Type:General  Level of Consciousness: awake, drowsy and patient cooperative  Airway & Oxygen Therapy: Patient Spontanous Breathing and Patient connected to face mask oxygen  Post-op Assessment: Report given to RN and Post -op Vital signs reviewed and stable  Post vital signs: Reviewed and stable  Last Vitals:  Vitals Value Taken Time  BP 141/73 07/01/20 1212  Temp    Pulse 67 07/01/20 1214  Resp 17 07/01/20 1214  SpO2 98 % 07/01/20 1214  Vitals shown include unvalidated device data.  Last Pain:  Vitals:   07/01/20 1053  TempSrc: Oral  PainSc: 0-No pain         Complications: No notable events documented.

## 2020-07-02 ENCOUNTER — Encounter: Payer: Self-pay | Admitting: Urology

## 2020-07-04 ENCOUNTER — Telehealth: Payer: Self-pay

## 2020-07-04 NOTE — Telephone Encounter (Signed)
Patient called stating that he is having trouble post URS 07/01/20. He is experiencing flank pain and spasm discomfort with urgency and a few episodes of urge incontinence. He would like to know if this is normal. It was explained that this can happen with a stent in place. He is currently using NSAIDs as needed and taking Tamsulosin daily. An additional bladder spasm medication was offered to help with discomfort patient states he is ok for now and will call back if he needs another medication sent in. He will keep stent removal as scheduled.

## 2020-07-14 ENCOUNTER — Encounter: Payer: Self-pay | Admitting: Urology

## 2020-07-14 ENCOUNTER — Ambulatory Visit (INDEPENDENT_AMBULATORY_CARE_PROVIDER_SITE_OTHER): Payer: Medicare Other | Admitting: Urology

## 2020-07-14 ENCOUNTER — Other Ambulatory Visit: Payer: Self-pay

## 2020-07-14 VITALS — BP 150/93 | HR 69 | Ht 68.0 in | Wt 228.0 lb

## 2020-07-14 DIAGNOSIS — Z466 Encounter for fitting and adjustment of urinary device: Secondary | ICD-10-CM | POA: Diagnosis not present

## 2020-07-14 DIAGNOSIS — N200 Calculus of kidney: Secondary | ICD-10-CM

## 2020-07-14 LAB — URINALYSIS, COMPLETE
Bilirubin, UA: NEGATIVE
Nitrite, UA: NEGATIVE
Specific Gravity, UA: >1.03 — ABNORMAL HIGH (ref 1.005–1.030)
Urobilinogen, Ur: 2 mg/dL — ABNORMAL HIGH (ref 0.2–1.0)
pH, UA: 7 (ref 5.0–7.5)

## 2020-07-14 LAB — MICROSCOPIC EXAMINATION
Bacteria, UA: NONE SEEN
RBC, Urine: >30 /hpf — ABNORMAL HIGH (ref 0–2)

## 2020-07-14 MED ORDER — LIDOCAINE HCL URETHRAL/MUCOSAL 2 % EX GEL
1.0000 "application " | Freq: Once | CUTANEOUS | Status: AC
  Administered 2020-07-14: 1 via URETHRAL

## 2020-07-14 NOTE — Patient Instructions (Signed)
Dietary Guidelines to Help Prevent Kidney Stones Kidney stones are deposits of minerals and salts that form inside your kidneys. Your risk of developing kidney stones may be greater depending on your diet, your lifestyle, the medicines you take, and whether you have certain medical conditions. Most people can lower their chances of developing kidney stones by following the instructions below. Your dietitian may give you more specific instructions depending on your overall health and the type of kidney stones you tend to develop. What are tips for following this plan? Reading food labels  Choose foods with "no salt added" or "low-salt" labels. Limit your salt (sodium) intake to less than 1,500 mg a day. Choose foods with calcium for each meal and snack. Try to eat about 300 mg of calcium at each meal. Foods that contain 200-500 mg of calcium a serving include: 8 oz (237 mL) of milk, calcium-fortifiednon-dairy milk, and calcium-fortifiedfruit juice. Calcium-fortified means that calcium has been added to these drinks. 8 oz (237 mL) of kefir, yogurt, and soy yogurt. 4 oz (114 g) of tofu. 1 oz (28 g) of cheese. 1 cup (150 g) of dried figs. 1 cup (91 g) of cooked broccoli. One 3 oz (85 g) can of sardines or mackerel. Most people need 1,000-1,500 mg of calcium a day. Talk to your dietitian about how much calcium is recommended for you. Shopping Buy plenty of fresh fruits and vegetables. Most people do not need to avoid fruits and vegetables, even if these foods contain nutrients that may contribute to kidney stones. When shopping for convenience foods, choose: Whole pieces of fruit. Pre-made salads with dressing on the side. Low-fat fruit and yogurt smoothies. Avoid buying frozen meals or prepared deli foods. These can be high in sodium. Look for foods with live cultures, such as yogurt and kefir. Choose high-fiber grains, such as whole-wheat breads, oat bran, and wheat cereals. Cooking Do not add  salt to food when cooking. Place a salt shaker on the table and allow each person to add his or her own salt to taste. Use vegetable protein, such as beans, textured vegetable protein (TVP), or tofu, instead of meat in pasta, casseroles, and soups. Meal planning Eat less salt, if told by your dietitian. To do this: Avoid eating processed or pre-made food. Avoid eating fast food. Eat less animal protein, including cheese, meat, poultry, or fish, if told by your dietitian. To do this: Limit the number of times you have meat, poultry, fish, or cheese each week. Eat a diet free of meat at least 2 days a week. Eat only one serving each day of meat, poultry, fish, or seafood. When you prepare animal protein, cut pieces into small portion sizes. For most meat and fish, one serving is about the size of the palm of your hand. Eat at least five servings of fresh fruits and vegetables each day. To do this: Keep fruits and vegetables on hand for snacks. Eat one piece of fruit or a handful of berries with breakfast. Have a salad and fruit at lunch. Have two kinds of vegetables at dinner. Limit foods that are high in a substance called oxalate. These include: Spinach (cooked), rhubarb, beets, sweet potatoes, and Swiss chard. Peanuts. Potato chips, french fries, and baked potatoes with skin on. Nuts and nut products. Chocolate. If you regularly take a diuretic medicine, make sure to eat at least 1 or 2 servings of fruits or vegetables that are high in potassium each day. These include: Avocado. Banana. Orange, prune,   carrot, or tomato juice. Baked potato. Cabbage. Beans and split peas. Lifestyle  Drink enough fluid to keep your urine pale yellow. This is the most important thing you can do. Spread your fluid intake throughout the day. If you drink alcohol: Limit how much you use to: 0-1 drink a day for women who are not pregnant. 0-2 drinks a day for men. Be aware of how much alcohol is in your  drink. In the [place] one drink equals one 12 oz bottle of beer (355 mL), one 5 oz glass of wine (148 mL), or one 1 oz glass of hard liquor (44 mL). Lose weight if told by your health care provider. Work with your dietitian to find an eating plan and weight loss strategies that work best for you. General information Talk to your health care provider and dietitian about taking daily supplements. You may be told the following depending on your health and the cause of your kidney stones: Not to take supplements with vitamin C. To take a calcium supplement. To take a daily probiotic supplement. To take other supplements such as magnesium, fish oil, or vitamin B6. Take over-the-counter and prescription medicines only as told by your health care provider. These include supplements. What foods should I limit? Limit your intake of the following foods, or eat them as told by your dietitian. Vegetables Spinach. Rhubarb. Beets. Canned vegetables. Pickles. Olives. Baked potatoes with skin. Grains Wheat bran. Baked goods. Salted crackers. Cereals high in sugar. Meats and other proteins Nuts. Nut butters. Large portions of meat, poultry, or fish. Salted, precooked, or cured meats, such as sausages, meat loaves, and hot dogs. Dairy Cheese. Beverages Regular soft drinks. Regular vegetable juice. Seasonings and condiments Seasoning blends with salt. Salad dressings. Soy sauce. Ketchup. Barbecue sauce. Other foods Canned soups. Canned pasta sauce. Casseroles. Pizza. Lasagna. Frozen meals. Potato chips. French fries. The items listed above may not be a complete list of foods and beverages you should limit. Contact a dietitian for more information. What foods should I avoid? Talk to your dietitian about specific foods you should avoid based on the type of kidney stones you have and your overall health. Fruits Grapefruit. The item listed above may not be a complete list of foods and beverages you should  avoid. Contact a dietitian for more information. Summary Kidney stones are deposits of minerals and salts that form inside your kidneys. You can lower your risk of kidney stones by making changes to your diet. The most important thing you can do is drink enough fluid. Drink enough fluid to keep your urine pale yellow. Talk to your dietitian about how much calcium you should have each day, and eat less salt and animal protein as told by your dietitian. This information is not intended to replace advice given to you by your health care provider. Make sure you discuss any questions you have with your health care provider. Document Revised: 01/01/2019 Document Reviewed: 01/01/2019 Elsevier Patient Education  2022 Else[employer] Inc.  

## 2020-07-14 NOTE — Progress Notes (Signed)
Cystoscopy Procedure Note:  Indication: Stent removal s/p 07/01/2020 left ureteroscopy for impacted 5 mm left proximal ureteral stone  After informed consent and discussion of the procedure and its risks, RIYAD KEENA was positioned and prepped in the standard fashion. Cystoscopy was performed with a flexible cystoscope. The stent was grasped with flexible graspers and removed in its entirety. The patient tolerated the procedure well.  Findings: Uncomplicated stent removal  Assessment and Plan: We discussed general stone prevention strategies including adequate hydration with goal of producing 2.5 L of urine daily, increasing citric acid intake, increasing calcium intake during high oxalate meals, minimizing animal protein, and decreasing salt intake. Information about dietary recommendations given today.   Follow up in 4 weeks with renal ultrasound to evaluate for silent hydronephrosis Continue yearly PSA screening with Esaw Dace, MD 07/14/2020

## 2020-08-18 ENCOUNTER — Other Ambulatory Visit: Payer: Self-pay

## 2020-08-18 ENCOUNTER — Ambulatory Visit
Admission: RE | Admit: 2020-08-18 | Discharge: 2020-08-18 | Disposition: A | Discharge status: Home / Self Care | Payer: Medicare Other | Source: Ambulatory Visit | Attending: Urology | Admitting: Urology

## 2020-08-18 DIAGNOSIS — N200 Calculus of kidney: Secondary | ICD-10-CM | POA: Diagnosis present

## 2020-08-18 DIAGNOSIS — Z466 Encounter for fitting and adjustment of urinary device: Secondary | ICD-10-CM | POA: Diagnosis present

## 2021-01-11 NOTE — Progress Notes (Signed)
01/12/2021 12:16 PM   Shawn Stevens Nov 25, 1950 [id number]  Referring provider: Sallee Lange, NP [address] [place],  [place] [address]  No chief complaint on file.  Urological history: 1. BPH with LU TS -PSA pending -I PSS 6/2  2. ED -contributing factors of age,  BPH, DM, HTN, HLD and sleep apnea  3. Nephrolithiasis -left URS 2022-5 mm ureteral stone -Passed a stone spontaneously in the 1980s  HPI: Shawn Stevens is a 70 y.o. male who presents today for 1 year follow-up.  He has no urinary complaints at this visit.  Patient denies any modifying or aggravating factors.  Patient denies any gross hematuria, dysuria or suprapubic/flank pain.  Patient denies any fevers, chills, nausea or vomiting.     IPSS     Row Name 01/12/21 1100         International Prostate Symptom Score   How often have you had the sensation of not emptying your bladder? Less than 1 in 5     How often have you had to urinate less than every two hours? Less than 1 in 5 times     How often have you found you stopped and started again several times when you urinated? Less than 1 in 5 times     How often have you found it difficult to postpone urination? Less than half the time     How often have you had a weak urinary stream? Less than 1 in 5 times     How often have you had to strain to start urination? Not at All     How many times did you typically get up at night to urinate? None     Total IPSS Score 6       Quality of Life due to urinary symptoms   If you were to spend the rest of your life with your urinary condition just the way it is now how would you feel about that? Mostly Satisfied              Score:  1-7 Mild 8-19 Moderate 20-35 Severe   PMH: Past Medical History:  Diagnosis Date   Anemia    BPH (benign prostatic hypertrophy)    Diabetes mellitus (HCC)    ED (erectile dysfunction)    GERD (gastroesophageal reflux disease)    Heart disease    HLD  (hyperlipidemia)    HTN (hypertension)    Kidney stone    Over weight    Sleep apnea    uses cpap    Surgical History: Past Surgical History:  Procedure Laterality Date   BACK SURGERY  2011   CYSTOSCOPY/URETEROSCOPY/HOLMIUM LASER/STENT PLACEMENT Left 07/01/2020   Procedure: CYSTOSCOPY/URETEROSCOPY/HOLMIUM LASER/STENT PLACEMENT;  Surgeon: Billey Co, MD;  Location: [facility] ORS;  Service: Urology;  Laterality: Left;    Home Medications:  Allergies as of 01/12/2021   No Known Allergies      Medication List        Accurate as of January 12, 2021 12:16 PM. If you have any questions, ask your nurse or doctor.          aspirin EC 81 MG tablet Take 81 mg by mouth in the morning. Swallow whole.   Cholecalciferol 25 MCG (1000 UT) tablet Take 1,000 Units by mouth in the morning.   ferrous sulfate 325 (65 FE) MG tablet Take 325 mg by mouth daily with breakfast.   glucose blood test strip Use 1 strip via meter  twice daily as directed.   insulin NPH-regular Human (70-30) 100 UNIT/ML injection Inject 50 Units into the skin 2 (two) times daily with a meal.   INSULIN SYRINGE .5CC/31GX5/16" 31G X 5/16" 0.5 ML Misc as directed (twice daily insulin dosing.)   lisinopril 20 MG tablet Commonly known as: ZESTRIL Take 20 mg by mouth in the morning.   Magnesium 250 MG Tabs Take 250 mg by mouth in the morning.   metFORMIN 1000 MG tablet Commonly known as: GLUCOPHAGE Take 1,000 mg by mouth 2 (two) times daily with a meal.   omeprazole 20 MG capsule Commonly known as: PRILOSEC Take 20 mg by mouth every other day. In the morning   pyridoxine 100 MG tablet Commonly known as: B-6 Take 100 mg by mouth in the morning.   simvastatin 10 MG tablet Commonly known as: ZOCOR Take 5 mg by mouth every evening.   tamsulosin 0.4 MG Caps capsule Commonly known as: FLOMAX Take 0.4 mg by mouth every evening.   vitamin B-12 500 MCG tablet Commonly known as: CYANOCOBALAMIN Take 500  mcg by mouth in the morning.        Allergies: No Known Allergies  Family History: Family History  Problem Relation Age of Onset   Pancreatic cancer Maternal Grandmother    Heart disease Father    Kidney disease Neg Hx    Prostate cancer Neg Hx    Bladder Cancer Neg Hx     Social History:  reports that he has never smoked. He has never used smokeless tobacco. He reports that he does not drink alcohol and does not use drugs.  ROS: For pertinent review of systems please refer to history of present illness  Physical Exam: BP 120/63    Pulse 62    Ht 5\' 8"  (1.727 m)    Wt 228 lb (103.4 kg)    BMI 34.67 kg/m   Constitutional:  Well nourished. Alert and oriented, No acute distress. HEENT: [place] AT, mask in place  Trachea midline Cardiovascular: No clubbing, cyanosis, or edema. Respiratory: Normal respiratory effort, no increased work of breathing. GU: No CVA tenderness.  No bladder fullness or masses.  Patient with uncircumcised phallus. Foreskin easily retracted  Urethral meatus is patent.  No penile discharge. No penile lesions or rashes. Scrotum without lesions, cysts, rashes and/or edema.  Testicles are located scrotally bilaterally. No masses are appreciated in the testicles. Left and right epididymis are normal. Rectal: Patient with  normal sphincter tone. Anus and perineum without scarring or rashes. No rectal masses are appreciated. Prostate is approximately 50 grams, no nodules are appreciated. Seminal vesicles could not be palpated Neurologic: Grossly intact, no focal deficits, moving all 4 extremities. Psychiatric: Normal mood and affect.   Laboratory Data: Hemoglobin A1C 4.2 - 5.6 % 6.5 High    Average Blood Glucose (Calc) mg/dL   Resulting Agency  [facility]  Narrative Performed by [facility] - LAB Normal Range:    4.2 - 5.6%  Increased Risk:  5.7 - 6.4%  Diabetes:        >= 6.5%  Glycemic Control for adults with diabetes:  <7%   Specimen  Collected: 07/29/20 08:41 Last Resulted: 07/29/20 16:06  [facility]  Result Received: 08/18/20 09:27  I have reviewed the labs.    Pertinent Imaging N/A  Assessment & Plan:    1.  BPH with LUTS -PSA pending -DRE benign -continue conservative management, avoiding bladder irritants and  timed voiding's    Return in about 1 year (around 01/12/2022) for IPSS, PSA and exam.  Zara Council, [facility]  [address] [place], [place] [address] [phone]

## 2021-01-12 ENCOUNTER — Other Ambulatory Visit: Payer: Self-pay

## 2021-01-12 ENCOUNTER — Ambulatory Visit (INDEPENDENT_AMBULATORY_CARE_PROVIDER_SITE_OTHER): Payer: Medicare Other | Admitting: Urology

## 2021-01-12 ENCOUNTER — Encounter: Payer: Self-pay | Admitting: Urology

## 2021-01-12 VITALS — BP 120/63 | HR 62 | Ht 68.0 in | Wt 228.0 lb

## 2021-01-12 DIAGNOSIS — N401 Enlarged prostate with lower urinary tract symptoms: Secondary | ICD-10-CM

## 2021-01-12 DIAGNOSIS — N138 Other obstructive and reflux uropathy: Secondary | ICD-10-CM | POA: Diagnosis not present

## 2021-01-13 LAB — PSA: Prostate Specific Ag, Serum: 0.2 ng/mL (ref 0.0–4.0)

## 2021-09-28 IMAGING — CT CT RENAL STONE PROTOCOL
2 of 4 series · 17 of 46 positions shown, 19 images · non-contrast
Comparison: None.

CLINICAL DATA: Left flank pain

EXAM:
CT ABDOMEN AND PELVIS WITHOUT CONTRAST
TECHNIQUE: Multidetector CT imaging of the abdomen and pelvis was performed
following the standard protocol without IV contrast.

[Series 2: renal stone 5.00 · axial · 0.98mm/px · z∈[-1546,-1086]mm · 14 of 102 slices shown, 16 images]
[im 5/102  soft-tissue]
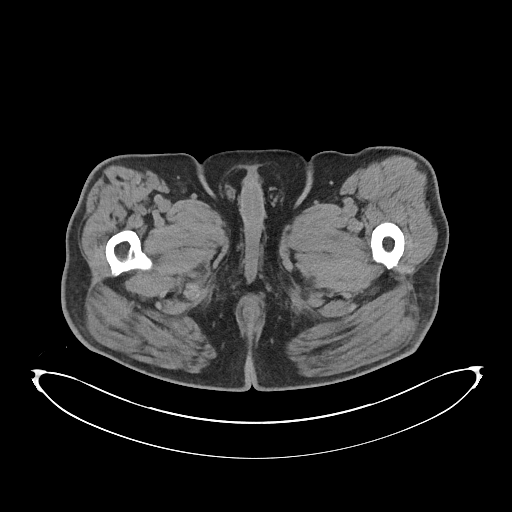
[im 5/102  bone]
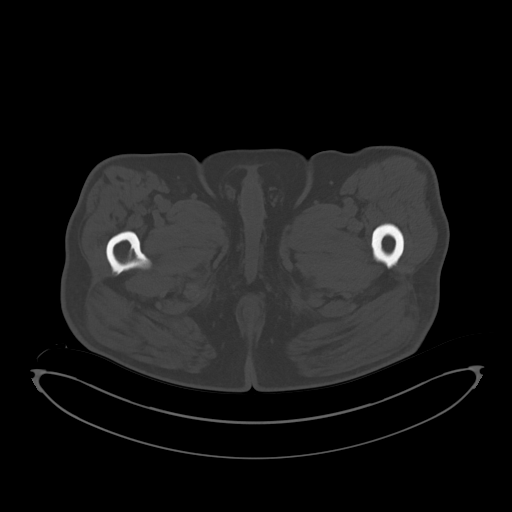
[im 13/102  soft-tissue]
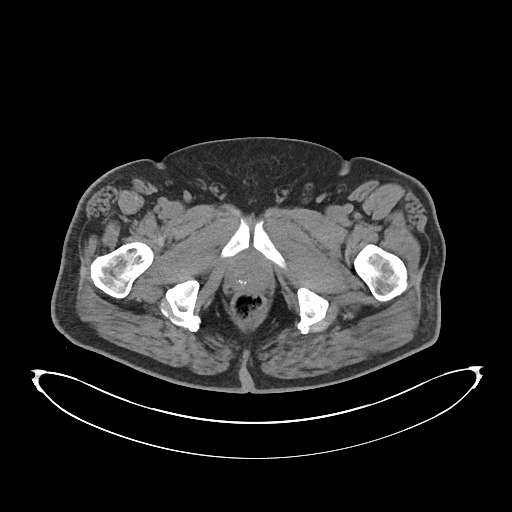
[im 22/102  soft-tissue]
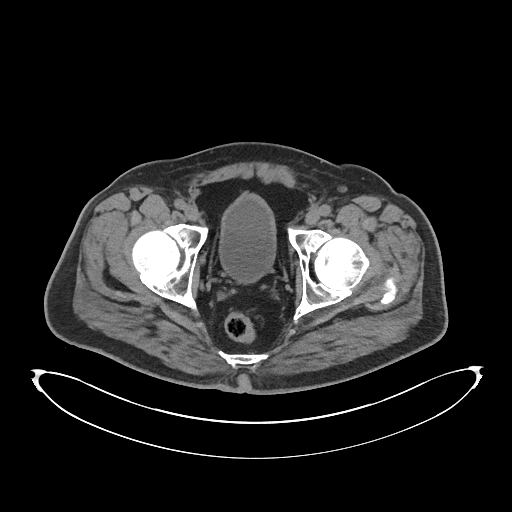
[im 26/102  soft-tissue]
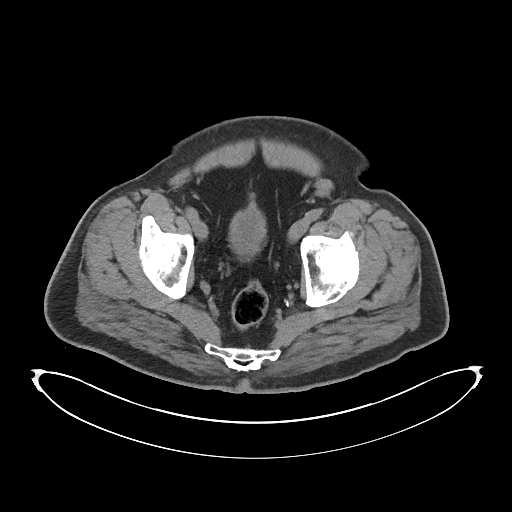
[im 34/102  soft-tissue]
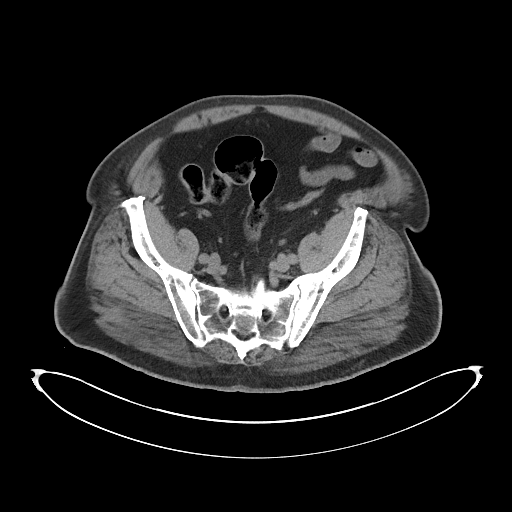
[im 43/102  soft-tissue]
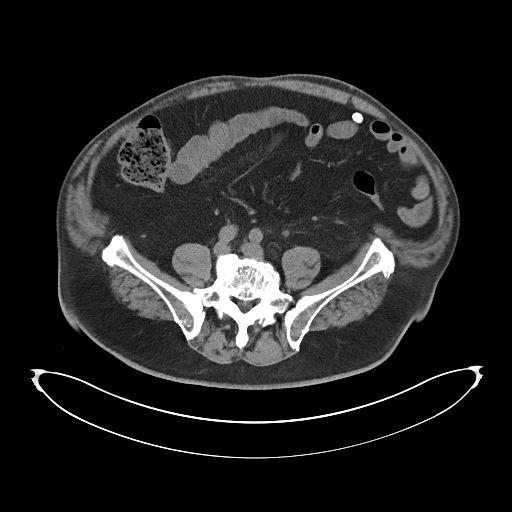
[im 47/102  soft-tissue]
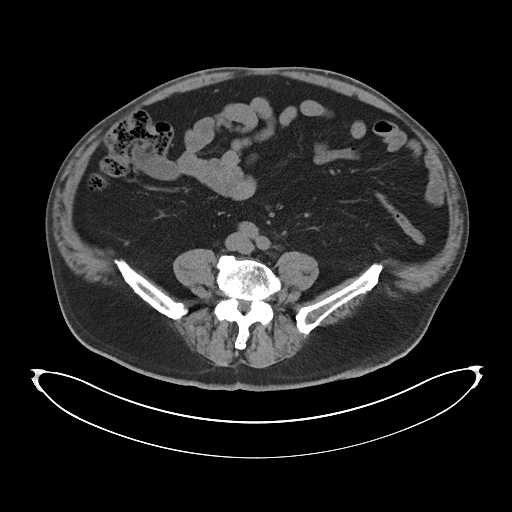
[im 55/102  soft-tissue]
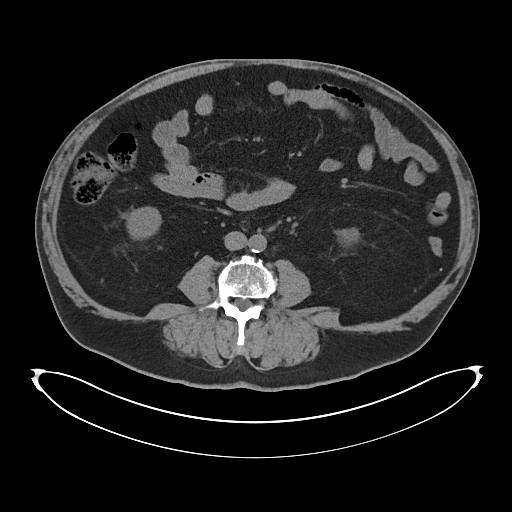
[im 59/102  soft-tissue]
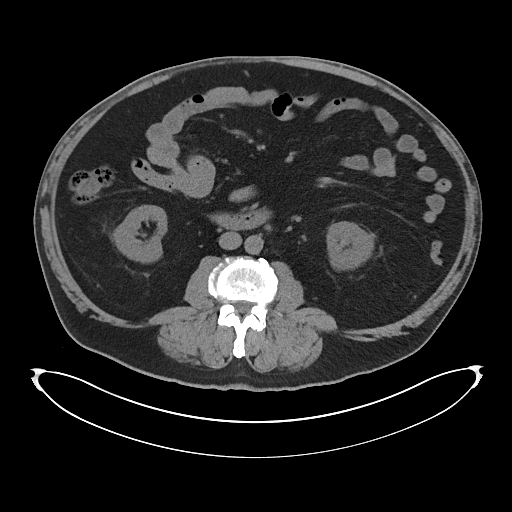
[im 59/102  bone]
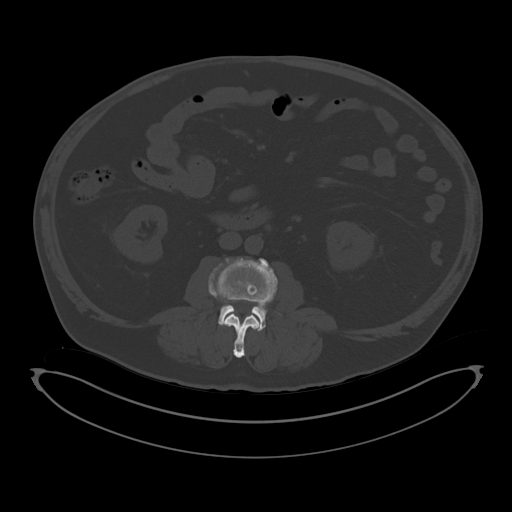
[im 68/102  soft-tissue]
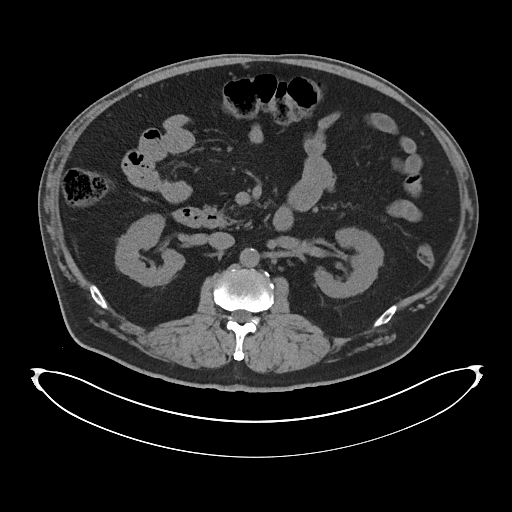
[im 76/102  soft-tissue]
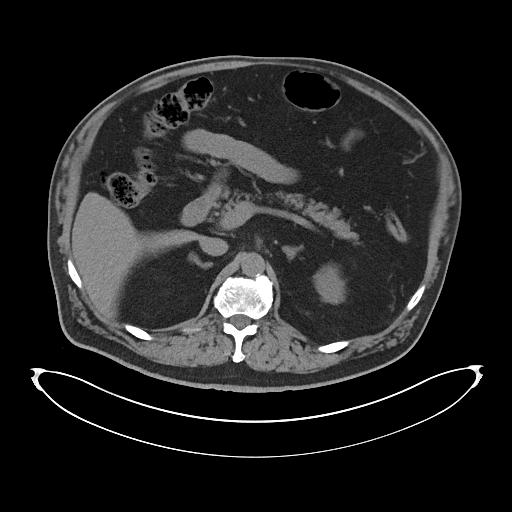
[im 80/102  soft-tissue]
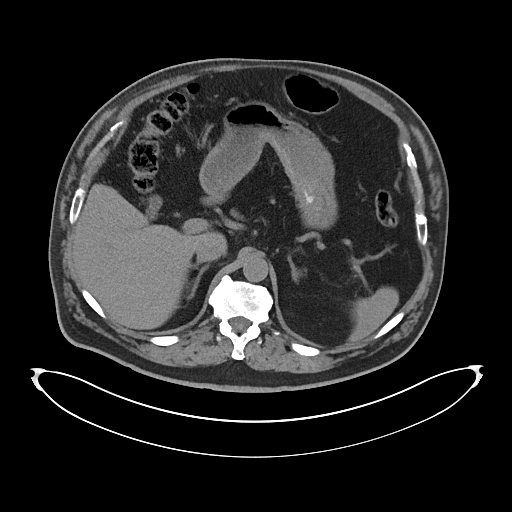
[im 89/102  soft-tissue]
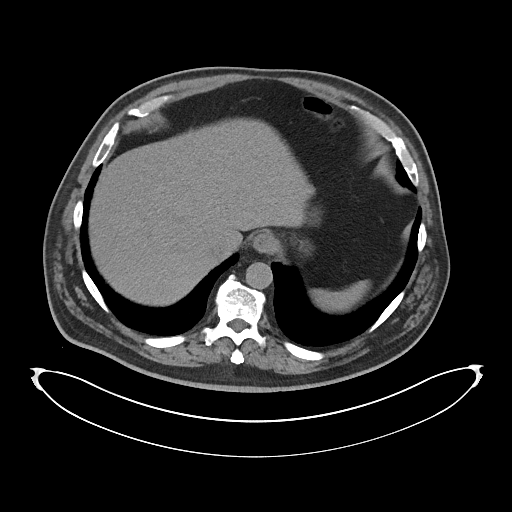
[im 97/102  soft-tissue]
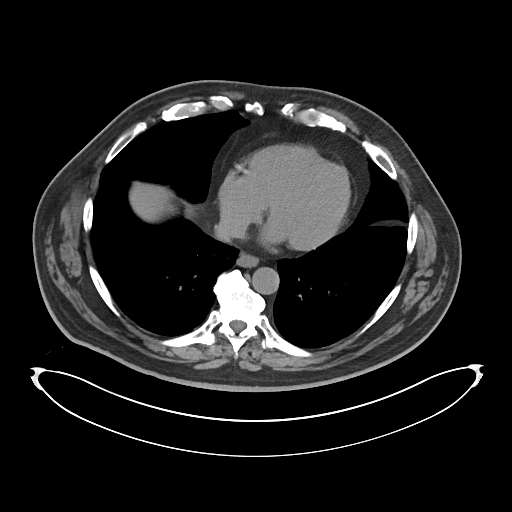

[Series 4: renal stone 2.00 cor · coronal · 0.87mm/px · 3 of 167 slices shown]
[im 56/167  soft-tissue]
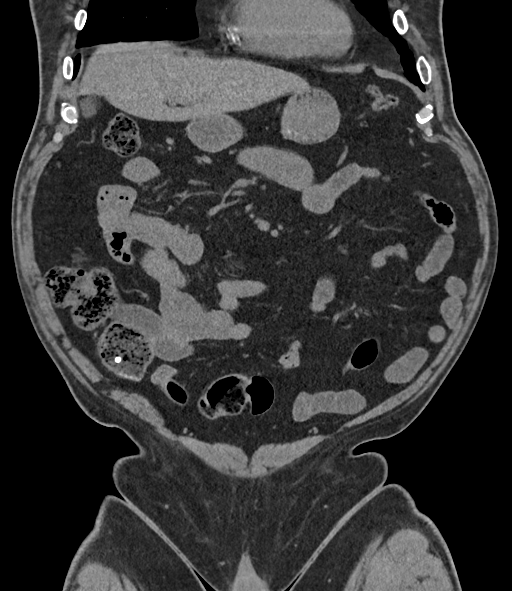
[im 74/167  soft-tissue]
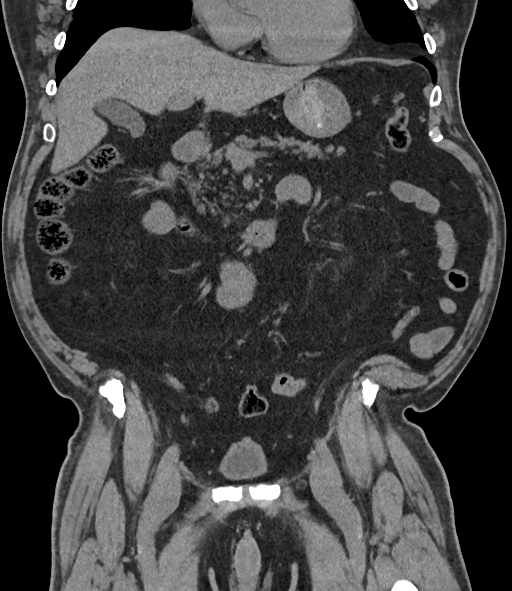
[im 93/167  soft-tissue]
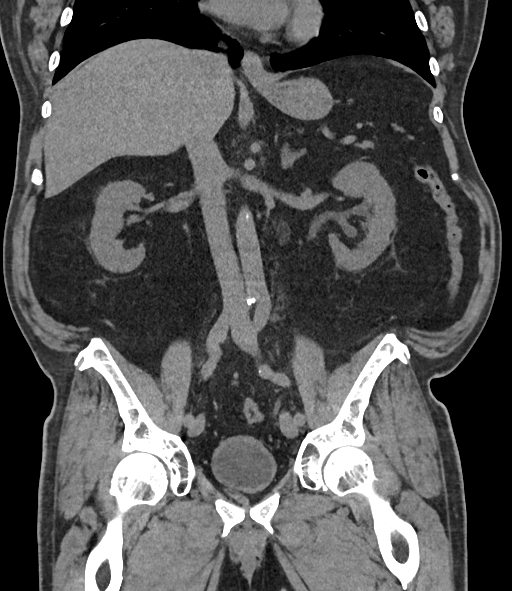

[17 of 46 positions shown; findings below may reference images not displayed]

FINDINGS: Lower chest: Lung bases are clear.

Hepatobiliary: Unenhanced liver is unremarkable.

Gallbladder is unremarkable. No intrahepatic or extrahepatic ductal
dilatation.

Pancreas: Within normal limits.

Spleen: Within normal limits.

Adrenals/Urinary Tract: Adrenal glands are within normal limits.

Kidneys are within normal limits.  Mild left hydronephrosis.

4 mm proximal left ureteral calculus at the L2-3 level.

Bladder is within normal limits.

Stomach/Bowel: Stomach is within normal limits.

No evidence of bowel obstruction.

Normal appendix (series 2/image 64).

Vascular/Lymphatic: No evidence of abdominal aortic aneurysm.

Atherosclerotic calcifications of the abdominal aorta and branch
vessels.

No suspicious abdominopelvic lymphadenopathy.

Reproductive: Prostate is unremarkable.

Other: No abdominopelvic ascites.

Musculoskeletal: Degenerative changes of the visualized
thoracolumbar spine.
IMPRESSION: 4 mm proximal left ureteral calculus at the L2-3 level. Mild left
hydronephrosis.

## 2021-11-18 IMAGING — US US RENAL
1 series · 14 of 25 positions shown · non-contrast
Comparison: Renal stone protocol CT 06/28/2020

CLINICAL DATA: Nephrolithiasis

EXAM:
RENAL / URINARY TRACT ULTRASOUND COMPLETE

[Series 1: us renal · 14 of 47 slices shown]
[im 1/47]
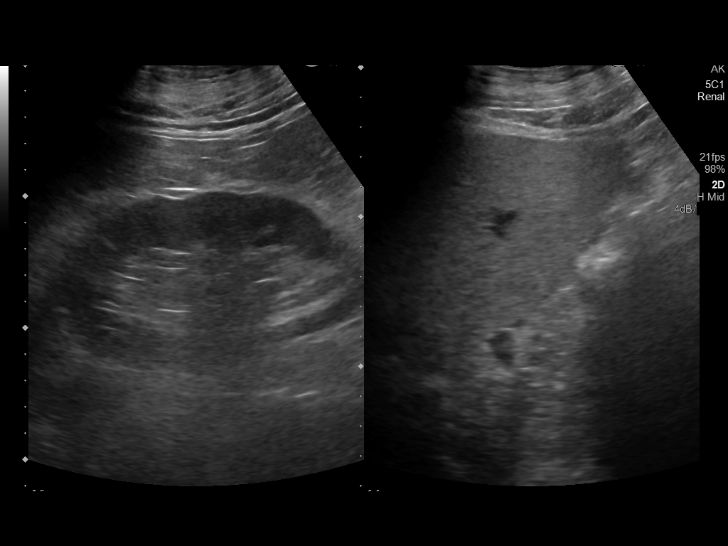
[im 4/47]
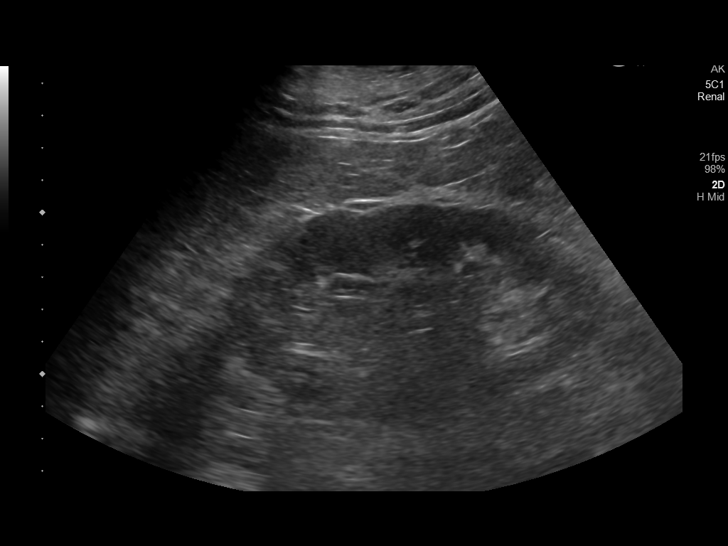
[im 8/47]
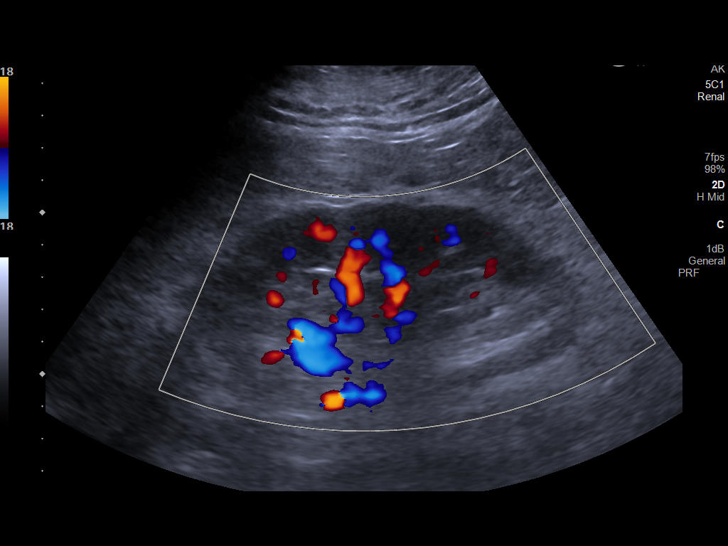
[im 12/47]
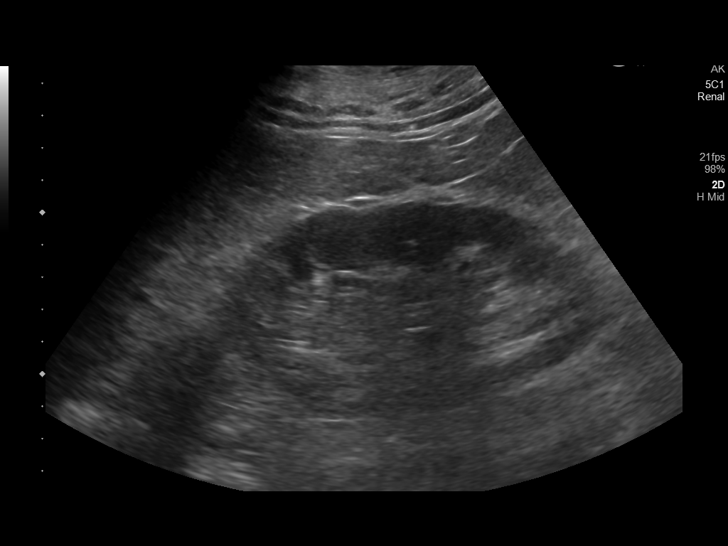
[im 16/47]
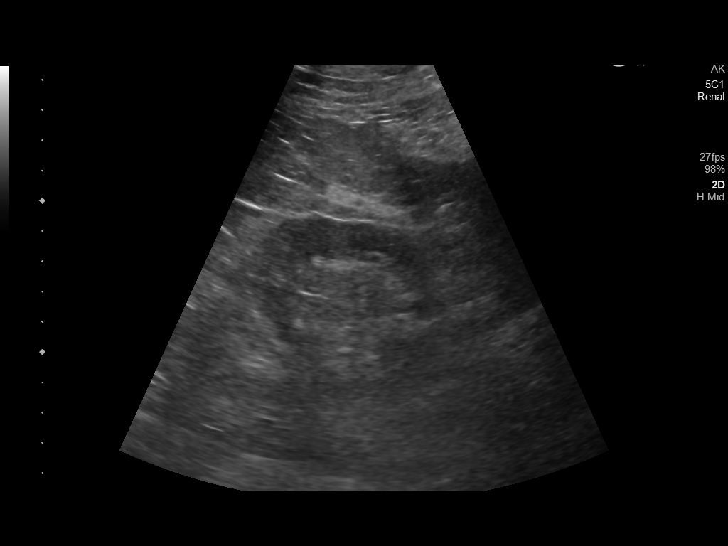
[im 18/47]
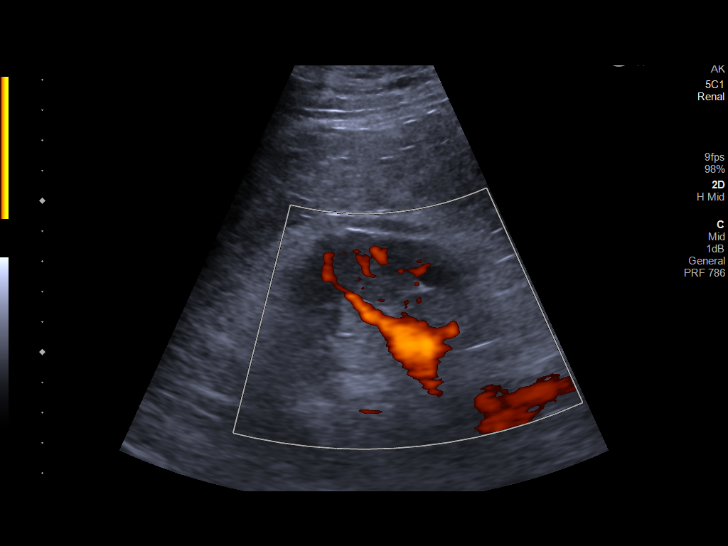
[im 22/47]
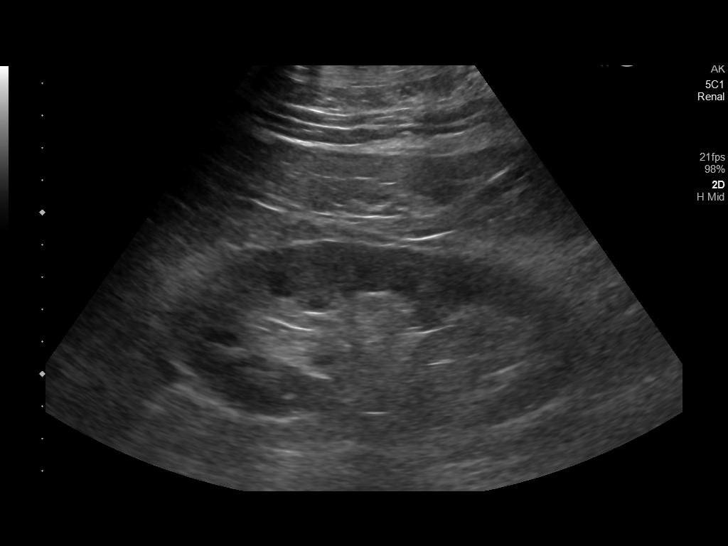
[im 25/47]
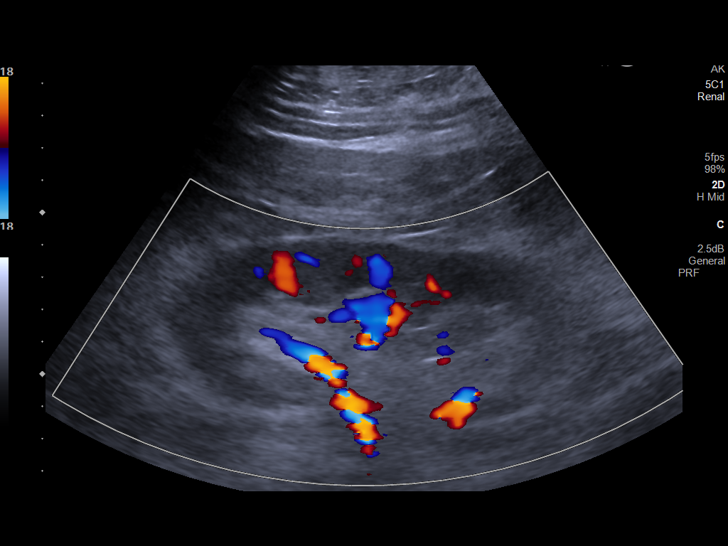
[im 29/47]
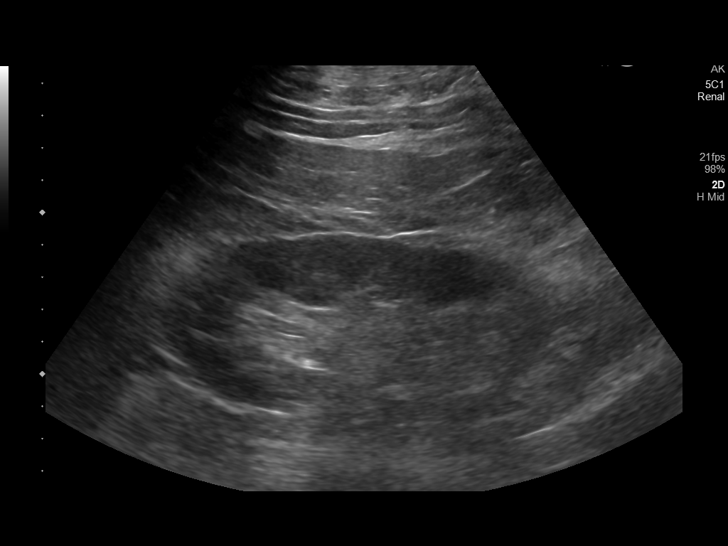
[im 31/47]
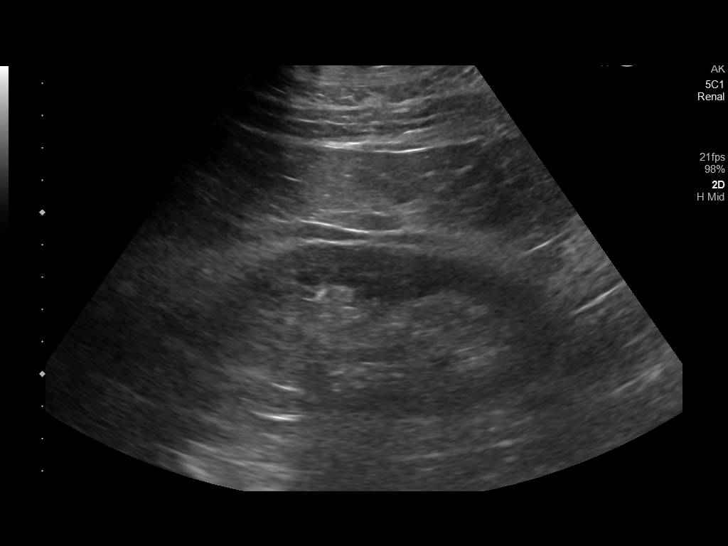
[im 35/47]
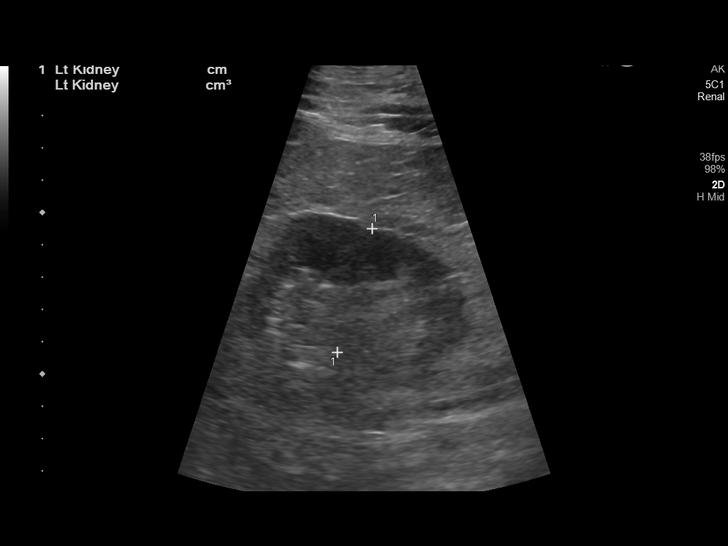
[im 39/47]
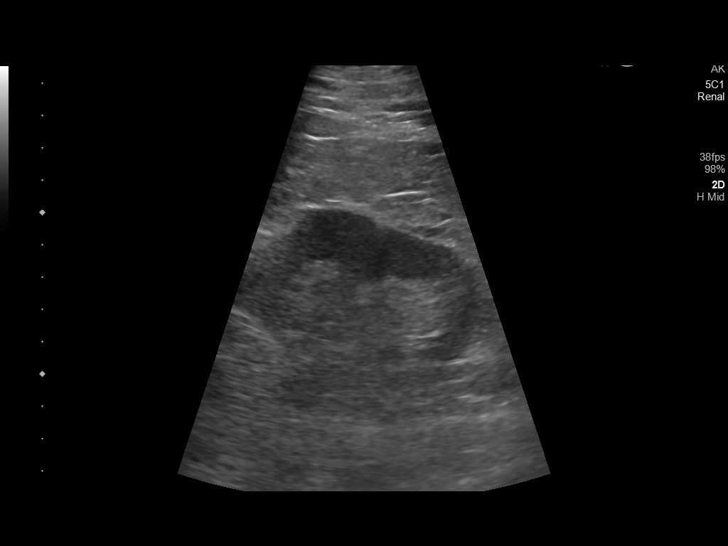
[im 43/47]
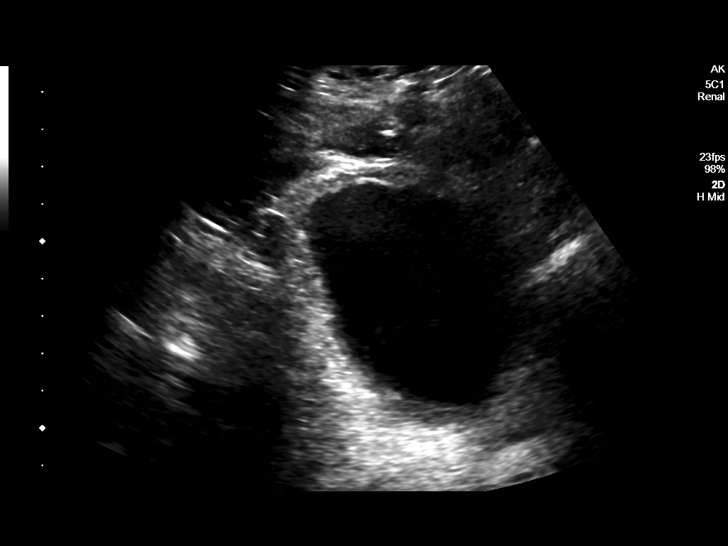
[im 47/47]
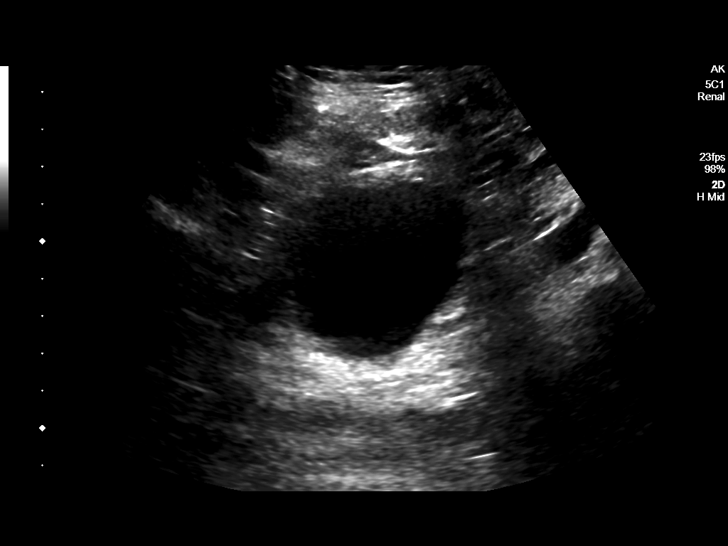

[14 of 25 positions shown; findings below may reference images not displayed]

FINDINGS: Right Kidney:

Renal measurements: 12.2 x 6.5 x 4.1 cm = volume: 169 mL.
Echogenicity within normal limits. No mass or hydronephrosis
visualized.

Left Kidney:

Renal measurements: 12.2 x 5.4 x 4.0 cm = volume: 138 mL.
Echogenicity within normal limits. No mass or hydronephrosis
visualized.

Bladder:

Appears normal for degree of bladder distention.

Other:

None.
IMPRESSION: No significant sonographic abnormality of the kidneys.

## 2022-01-10 NOTE — Progress Notes (Unsigned)
01/11/2022 9:11 AM   Shawn Stevens 11-09-1950 [id number]  Referring provider: Myrene Buddy, NP [address] [place],  [place] [address]  Urological history: 1. BPH with LU TS -PSA pending -I PSS 21/2  2. ED -contributing factors of age,  BPH, DM, HTN, HLD and sleep apnea -Intolerable to PDE 5 inhibitors -Not interested in further treatments or management  3. Nephrolithiasis -left URS 2022-5 mm ureteral stone -Passed a stone spontaneously in the 1980s  HPI: Shawn Stevens is a 71 y.o. male who presents today for 1 year follow-up.  He has baseline frequency, urgent urination, urgent urination, post-void dribbling and weak/stream.  These symptoms are not bothersome to him.  Patient denies any modifying or aggravating factors.  Patient denies any gross hematuria, dysuria or suprapubic/flank pain.  Patient denies any fevers, chills, nausea or vomiting.     IPSS     Row Name 01/11/22 0800         International Prostate Symptom Score   How often have you had the sensation of not emptying your bladder? About half the time     How often have you had to urinate less than every two hours? About half the time     How often have you found you stopped and started again several times when you urinated? More than half the time     How often have you found it difficult to postpone urination? More than half the time     How often have you had a weak urinary stream? More than half the time     How often have you had to strain to start urination? About half the time     How many times did you typically get up at night to urinate? None     Total IPSS Score 21       Quality of Life due to urinary symptoms   If you were to spend the rest of your life with your urinary condition just the way it is now how would you feel about that? Mostly Satisfied               Score:  1-7 Mild 8-19 Moderate 20-35 Severe   PMH: Past Medical History:  Diagnosis Date   Anemia    BPH  (benign prostatic hypertrophy)    Diabetes mellitus (HCC)    ED (erectile dysfunction)    GERD (gastroesophageal reflux disease)    Heart disease    HLD (hyperlipidemia)    HTN (hypertension)    Kidney stone    Over weight    Sleep apnea    uses cpap    Surgical History: Past Surgical History:  Procedure Laterality Date   BACK SURGERY  2011   CYSTOSCOPY/URETEROSCOPY/HOLMIUM LASER/STENT PLACEMENT Left 07/01/2020   Procedure: CYSTOSCOPY/URETEROSCOPY/HOLMIUM LASER/STENT PLACEMENT;  Surgeon: Sondra Come, MD;  Location: [facility] ORS;  Service: Urology;  Laterality: Left;    Home Medications:  Allergies as of 01/11/2022   No Known Allergies      Medication List        Accurate as of January 11, 2022  9:11 AM. If you have any questions, ask your nurse or doctor.          aspirin EC 81 MG tablet Take 81 mg by mouth in the morning. Swallow whole.   Cholecalciferol 25 MCG (1000 UT) tablet Take 1,000 Units by mouth in the morning.   cyanocobalamin 500 MCG tablet Commonly known as: VITAMIN B12 Take  500 mcg by mouth in the morning.   ferrous sulfate 325 (65 FE) MG tablet Take 325 mg by mouth daily with breakfast.   glucose blood test strip Use 1 strip via meter twice daily as directed.   insulin NPH-regular Human (70-30) 100 UNIT/ML injection Inject 100 Units into the skin 2 (two) times daily with a meal.   INSULIN SYRINGE .5CC/31GX5/16" 31G X 5/16" 0.5 ML Misc as directed (twice daily insulin dosing.)   lisinopril 20 MG tablet Commonly known as: ZESTRIL Take 20 mg by mouth in the morning.   Magnesium 250 MG Tabs Take 250 mg by mouth in the morning.   metFORMIN 1000 MG tablet Commonly known as: GLUCOPHAGE Take 1,000 mg by mouth 2 (two) times daily with a meal.   omeprazole 20 MG capsule Commonly known as: PRILOSEC Take 20 mg by mouth every other day. In the morning   pyridoxine 100 MG tablet Commonly known as: B-6 Take 100 mg by mouth in the morning.    simvastatin 10 MG tablet Commonly known as: ZOCOR Take 5 mg by mouth every evening.   tamsulosin 0.4 MG Caps capsule Commonly known as: FLOMAX Take 0.4 mg by mouth every evening.        Allergies: No Known Allergies  Family History: Family History  Problem Relation Age of Onset   Pancreatic cancer Maternal Grandmother    Heart disease Father    Kidney disease Neg Hx    Prostate cancer Neg Hx    Bladder Cancer Neg Hx     Social History:  reports that he has never smoked. He has never used smokeless tobacco. He reports that he does not drink alcohol and does not use drugs.  ROS: For pertinent review of systems please refer to history of present illness  Physical Exam: BP 121/61   Pulse 66   Ht 5\' 8"  (1.727 m)   Wt 228 lb (103.4 kg)   BMI 34.67 kg/m   Constitutional:  Well nourished. Alert and oriented, No acute distress. HEENT: [place] AT, moist mucus membranes.  Trachea midline Cardiovascular: No clubbing, cyanosis, or edema. Respiratory: Normal respiratory effort, no increased work of breathing. GU: No CVA tenderness.  No bladder fullness or masses.  Patient with uncircumcised phallus.  Foreskin easily retracted  Urethral meatus is patent.  No penile discharge. No penile lesions or rashes. Scrotum without lesions, cysts, rashes and/or edema.  Testicles are located scrotally bilaterally. No masses are appreciated in the testicles. Left and right epididymis are normal. Rectal: Patient with  normal sphincter tone. Anus and perineum without scarring or rashes. No rectal masses are appreciated. Prostate is approximately 50 grams, no nodules are appreciated. Seminal vesicles could not be palpated.  Neurologic: Grossly intact, no focal deficits, moving all 4 extremities. Psychiatric: Normal mood and affect.   Laboratory Data: Hemoglobin A1c (07/2021) 6.6 Serum creatinine (01/2021) 0.9 I have reviewed the labs.    Pertinent Imaging N/A  Assessment & Plan:    1.  BPH with  LUTS -PSA pending -DRE benign -continue conservative management, avoiding bladder irritants and timed voiding's -patient's symptoms are not worrisome as are stable and are consistent with BPH and an ageing bladder -he does not desire any medical treatment or outlet procedure at this time    Return in about 1 year (around 01/12/2023) for IPSS, PSA and exam.  Zara Council, PA-C [place] [address], [place] [phone]

## 2022-01-11 ENCOUNTER — Ambulatory Visit (INDEPENDENT_AMBULATORY_CARE_PROVIDER_SITE_OTHER): Payer: Medicare Other | Admitting: Urology

## 2022-01-11 ENCOUNTER — Other Ambulatory Visit: Payer: Self-pay

## 2022-01-11 ENCOUNTER — Encounter: Payer: Self-pay | Admitting: Urology

## 2022-01-11 VITALS — BP 121/61 | HR 66 | Ht 68.0 in | Wt 228.0 lb

## 2022-01-11 DIAGNOSIS — N138 Other obstructive and reflux uropathy: Secondary | ICD-10-CM | POA: Diagnosis not present

## 2022-01-11 DIAGNOSIS — N401 Enlarged prostate with lower urinary tract symptoms: Secondary | ICD-10-CM

## 2022-01-12 ENCOUNTER — Ambulatory Visit: Payer: PRIVATE HEALTH INSURANCE | Admitting: Urology

## 2022-01-12 LAB — PSA: Prostate Specific Ag, Serum: 0.2 ng/mL (ref 0.0–4.0)

## 2022-01-16 ENCOUNTER — Telehealth: Payer: Self-pay | Admitting: *Deleted

## 2022-01-16 NOTE — Telephone Encounter (Signed)
Left message per DPR . Appt is scheduled.

## 2022-01-16 NOTE — Telephone Encounter (Signed)
-----   Message from Harle Battiest, PA-C sent at 01/12/2022  7:34 PM EST ----- Please let Mr. Cervone know that his PSA is stable at 0.2.  We will see him December.

## 2022-02-16 DIAGNOSIS — K219 Gastro-esophageal reflux disease without esophagitis: Secondary | ICD-10-CM | POA: Diagnosis not present

## 2022-02-16 DIAGNOSIS — I10 Essential (primary) hypertension: Secondary | ICD-10-CM | POA: Diagnosis not present

## 2022-02-16 DIAGNOSIS — E113299 Type 2 diabetes mellitus with mild nonproliferative diabetic retinopathy without macular edema, unspecified eye: Secondary | ICD-10-CM | POA: Diagnosis not present

## 2022-02-16 DIAGNOSIS — L989 Disorder of the skin and subcutaneous tissue, unspecified: Secondary | ICD-10-CM | POA: Diagnosis not present

## 2022-02-16 DIAGNOSIS — Z0000 Encounter for general adult medical examination without abnormal findings: Secondary | ICD-10-CM | POA: Diagnosis not present

## 2022-02-16 DIAGNOSIS — Z794 Long term (current) use of insulin: Secondary | ICD-10-CM | POA: Diagnosis not present

## 2022-02-16 DIAGNOSIS — Z79899 Other long term (current) drug therapy: Secondary | ICD-10-CM | POA: Diagnosis not present

## 2022-02-16 DIAGNOSIS — Z1211 Encounter for screening for malignant neoplasm of colon: Secondary | ICD-10-CM | POA: Diagnosis not present

## 2022-02-16 DIAGNOSIS — E782 Mixed hyperlipidemia: Secondary | ICD-10-CM | POA: Diagnosis not present

## 2022-02-16 DIAGNOSIS — G4733 Obstructive sleep apnea (adult) (pediatric): Secondary | ICD-10-CM | POA: Diagnosis not present

## 2022-02-16 DIAGNOSIS — L0591 Pilonidal cyst without abscess: Secondary | ICD-10-CM | POA: Diagnosis not present

## 2022-02-19 ENCOUNTER — Ambulatory Visit: Payer: Self-pay | Admitting: Surgery

## 2022-02-19 DIAGNOSIS — L988 Other specified disorders of the skin and subcutaneous tissue: Secondary | ICD-10-CM | POA: Diagnosis not present

## 2022-02-19 NOTE — H&P (Signed)
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 is a 72 y.o. male who was referred by Sallee Lange, NP for evaluation of above. First noted several years ago.  Symptoms include: Pain is intermittent, drainage.  Exacerbated by nothing specific.  Alleviated by drainage.  Associated with drainage.     Past Medical History:  has a past medical history of BPH with obstruction/lower urinary tract symptoms, Carpal tunnel syndrome, COVID-19 (04/2020), Diabetes mellitus, type 2 (CMS-HCC) (1993), Diabetic retinopathy associated with type 2 diabetes mellitus (CMS-HCC), Erectile dysfunction, GERD (gastroesophageal reflux disease), Hyperlipidemia, Hypertension, Low back pain, Nephrolithiasis, Obesity (BMI 30-39.9), unspecified, and Sleep apnea.  Past Surgical History:  has a past surgical history that includes Destruction Progressive Retinopathy By Laser; Laminectomy Lumbar Spine (06/22/2009); egd (01/01/2003); Colonoscopy (10/12/2005, 01/01/2003); Colonoscopy (02/22/2011); Colonoscopy (02/15/2017); Mohs surgery (01/24/2017); Cataract extraction (Bilateral, 2019); and Cystoscopy w/ ureteroscopy (Left, 07/01/2020).  Family History: family history includes Diabetes type II in his mother; High blood pressure (Hypertension) in his father; Myocardial Infarction (Heart attack) in his father; Neuropathy in his mother.  Social History:  reports that he has never smoked. He has never used smokeless tobacco. He reports that he does not currently use alcohol. He reports that he does not use drugs.  Current Medications: has a current medication list which includes the following prescription(s): aspirin, relion prime test strips, blood glucose diagnostic, blood glucose meter, cholecalciferol, cyanocobalamin (vitamin b-12), ferrous sulfate, novolin 70/30 u-100 insulin, bd insulin syringe ultra-fine, losartan, magnesium, metformin, omeprazole, pyridoxine (vitamin b6), simvastatin, and  tamsulosin.  Allergies:  No Known Allergies  ROS:  A 15 point review of systems was performed and pertinent positives and negatives noted in HPI   Objective:     BP 126/75   Pulse 73   Ht 172.7 cm (5' 8"$ )   Wt (!) 106.6 kg (235 lb)   BMI 35.73 kg/m   Constitutional :  No distress, cooperative, alert  Lymphatics/Throat:  Supple with no lymphadenopathy  Respiratory:  Clear to auscultation bilaterally  Cardiovascular:  Regular rate and rhythm  Gastrointestinal: Soft, non-tender, non-distended, no organomegaly.  Musculoskeletal: Steady gait and movement  Skin: Cool and moist. Large drainage cavity adajacent to gluteal cleft with visible sinus in midline consistent with pilonidal disease.  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  Alternatives include continued observation.  Benefits include possible symptom relief, pathologic evaluation, improved cosmesis. Discussed the risk of surgery including recurrence, chronic pain, post-op infxn, poor cosmesis, poor/delayed wound healing, and possible re-operation to address said risks. The risks of general anesthetic, if used, includes MI, CVA, sudden death or even reaction to anesthetic medications also discussed.  Typical post-op recovery time of 3-5 days with possible activity restrictions were also discussed.  The patient verbalized understanding and all questions were answered to the patient's satisfaction.  2. Patient has elected to proceed with surgical treatment. Procedure will be scheduled. Will attempt closure but likely will need to stay open with wound care afterwards.  Discussed with patient and he is still comfortable proceeding.  Hold aspirin 7days prior  labs/images/medications/previous chart  entries reviewed personally and relevant changes/updates noted above.

## 2022-02-19 NOTE — H&P (View-Only) (Signed)
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 is a 72 y.o. male who was referred by Sallee Lange, NP for evaluation of above. First noted several years ago.  Symptoms include: Pain is intermittent, drainage.  Exacerbated by nothing specific.  Alleviated by drainage.  Associated with drainage.     Past Medical History:  has a past medical history of BPH with obstruction/lower urinary tract symptoms, Carpal tunnel syndrome, COVID-19 (04/2020), Diabetes mellitus, type 2 (CMS-HCC) (1993), Diabetic retinopathy associated with type 2 diabetes mellitus (CMS-HCC), Erectile dysfunction, GERD (gastroesophageal reflux disease), Hyperlipidemia, Hypertension, Low back pain, Nephrolithiasis, Obesity (BMI 30-39.9), unspecified, and Sleep apnea.  Past Surgical History:  has a past surgical history that includes Destruction Progressive Retinopathy By Laser; Laminectomy Lumbar Spine (06/22/2009); egd (01/01/2003); Colonoscopy (10/12/2005, 01/01/2003); Colonoscopy (02/22/2011); Colonoscopy (02/15/2017); Mohs surgery (01/24/2017); Cataract extraction (Bilateral, 2019); and Cystoscopy w/ ureteroscopy (Left, 07/01/2020).  Family History: family history includes Diabetes type II in his mother; High blood pressure (Hypertension) in his father; Myocardial Infarction (Heart attack) in his father; Neuropathy in his mother.  Social History:  reports that he has never smoked. He has never used smokeless tobacco. He reports that he does not currently use alcohol. He reports that he does not use drugs.  Current Medications: has a current medication list which includes the following prescription(s): aspirin, relion prime test strips, blood glucose diagnostic, blood glucose meter, cholecalciferol, cyanocobalamin (vitamin b-12), ferrous sulfate, novolin 70/30 u-100 insulin, bd insulin syringe ultra-fine, losartan, magnesium, metformin, omeprazole, pyridoxine (vitamin b6), simvastatin, and  tamsulosin.  Allergies:  No Known Allergies  ROS:  A 15 point review of systems was performed and pertinent positives and negatives noted in HPI   Objective:     BP 126/75   Pulse 73   Ht 172.7 cm (5' 8"$ )   Wt (!) 106.6 kg (235 lb)   BMI 35.73 kg/m   Constitutional :  No distress, cooperative, alert  Lymphatics/Throat:  Supple with no lymphadenopathy  Respiratory:  Clear to auscultation bilaterally  Cardiovascular:  Regular rate and rhythm  Gastrointestinal: Soft, non-tender, non-distended, no organomegaly.  Musculoskeletal: Steady gait and movement  Skin: Cool and moist. Large drainage cavity adajacent to gluteal cleft with visible sinus in midline consistent with pilonidal disease.  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  Alternatives include continued observation.  Benefits include possible symptom relief, pathologic evaluation, improved cosmesis. Discussed the risk of surgery including recurrence, chronic pain, post-op infxn, poor cosmesis, poor/delayed wound healing, and possible re-operation to address said risks. The risks of general anesthetic, if used, includes MI, CVA, sudden death or even reaction to anesthetic medications also discussed.  Typical post-op recovery time of 3-5 days with possible activity restrictions were also discussed.  The patient verbalized understanding and all questions were answered to the patient's satisfaction.  2. Patient has elected to proceed with surgical treatment. Procedure will be scheduled. Will attempt closure but likely will need to stay open with wound care afterwards.  Discussed with patient and he is still comfortable proceeding.  Hold aspirin 7days prior  labs/images/medications/previous chart  entries reviewed personally and relevant changes/updates noted above.

## 2022-02-26 ENCOUNTER — Other Ambulatory Visit: Payer: Self-pay

## 2022-02-26 ENCOUNTER — Encounter
Admission: RE | Admit: 2022-02-26 | Discharge: 2022-02-26 | Disposition: A | Discharge status: Home / Self Care | Payer: PPO | Source: Ambulatory Visit | Attending: Surgery | Admitting: Surgery

## 2022-02-26 VITALS — BP 135/57 | HR 61 | Resp 18 | Ht 68.0 in | Wt 235.0 lb

## 2022-02-26 DIAGNOSIS — Z794 Long term (current) use of insulin: Secondary | ICD-10-CM | POA: Insufficient documentation

## 2022-02-26 DIAGNOSIS — E11319 Type 2 diabetes mellitus with unspecified diabetic retinopathy without macular edema: Secondary | ICD-10-CM | POA: Diagnosis not present

## 2022-02-26 DIAGNOSIS — I10 Essential (primary) hypertension: Secondary | ICD-10-CM | POA: Diagnosis not present

## 2022-02-26 DIAGNOSIS — Z01818 Encounter for other preprocedural examination: Secondary | ICD-10-CM | POA: Insufficient documentation

## 2022-02-26 HISTORY — DX: Type 2 diabetes mellitus with unspecified diabetic retinopathy without macular edema: E11.319

## 2022-02-26 HISTORY — DX: Personal history of urinary calculi: Z87.442

## 2022-02-26 NOTE — Patient Instructions (Addendum)
Your procedure is scheduled on: 03/09/22 Report to [place]. To find out your arrival time please call [phone] between 1PM - 3PM on 03/08/22.  Remember: Instructions that are not followed completely may result in serious medical risk, up to and including death, or upon the discretion of your surgeon and anesthesiologist your surgery may need to be rescheduled.     _X__ 1. Do not eat food or drink any liquids after midnight the night before your procedure.                 No gum chewing or hard candies.   ______    Drink the Ensure "clear" pre surgery drink 2 hours before arriving for surgery  ______    Drink the G2 Gatorade drink 2 hours before arriving for surgery   __X__2.  On the morning of surgery brush your teeth with toothpaste and water, you                 may rinse your mouth with mouthwash if you wish.  Do not swallow any              toothpaste of mouthwash.     _X__ 3.  No Alcohol for 24 hours before or after surgery.   _X__ 4.  Do Not Smoke or use e-cigarettes For 24 Hours Prior to Your Surgery.                 Do not use any chewable tobacco products for at least 6 hours prior to                 surgery.  ____  5.  Bring all medications with you on the day of surgery if instructed.   __X__  6.  Notify your doctor if there is any change in your medical condition      (cold, fever, infections).     Do not wear jewelry, make-up, hairpins, clips or nail polish. Do not wear lotions, powders, or perfumes. You can use deodorant. Do not shave body hair 48 hours prior to surgery. Men may shave face and neck. Do not bring valuables to the hospital.    [facility] is not responsible for any belongings or valuables.  Contacts, dentures/partials or body piercings may not be worn into surgery. Bring a case for your contacts, glasses or hearing aids, a denture cup will be supplied. Leave your suitcase or overnight bag in  the car. After surgery it may be brought to your room. For patients admitted to the hospital, discharge time is determined by your treatment team. In case of increased patient census, it may be necessary for you, the patient, to continue your postoperative care within the Same Day Surgery department.   Patients discharged the day of surgery will need a responsible, legally licensed driver to drive them home. A learners permit is NOT acceptable. The driver must be free of impairment including anesthesia medications You will not be allowed to drive yourself home. If you are going home via [place], [place], Taxi or other public transportation (non medical) you MUST be accompanied be a responsible individual.    Please read over the following fact sheets that you were given:   MRSA Information, CHG soap  __X__ Take these medicines the morning of surgery with A SIP OF WATER:    1. omeprazole (PRILOSEC) 20 MG capsule   2.   3.   4.  5.  6.  ____ Fleet Enema (as directed)   __X__ Use CHG Soap/SAGE wipes as directed  ____ Use inhalers on the day of surgery  __X__ Stop metformin/Janumet/Farxiga 2 days prior to surgery  wED 03/07/22   __X__ Take 1/2 of usual insulin dose the night before surgery. No insulin the morning          of surgery.   ____ Stop Blood Thinners Coumadin/Plavix/Xarelto/Pleta/Pradaxa/Eliquis/Effient/Aspirin  on   Or contact your Surgeon, Cardiologist or Medical Doctor regarding  ability to stop your blood thinners  __X__ Stop Anti-inflammatories 7 days before surgery such as Advil, Ibuprofen, Motrin,  BC or Goodies Powder, Naprosyn, Naproxen, Aleve,    __X__ Stop all herbals and supplements, fish oil or vitamins 7 days before surgery.      ____ Bring C-Pap to the hospital.   LAST DOSE OF ASPIRIN IS 03/03/22  (STOP SUPPLEMENTS ALSO)     Preparing for Surgery with CHLORHEXIDINE GLUCONATE (CHG) Soap  Chlorhexidine Gluconate (CHG) Soap  o An antiseptic cleaner that kills  germs and bonds with the skin to continue killing germs even after washing  o Used for showering the night before surgery and morning of surgery  Before surgery, you can play an important role by reducing the number of germs on your skin.  CHG (Chlorhexidine gluconate) soap is an antiseptic cleanser which kills germs and bonds with the skin to continue killing germs even after washing.  Please do not use if you have an allergy to CHG or antibacterial soaps. If your skin becomes reddened/irritated stop using the CHG.  1. Shower the NIGHT BEFORE SURGERY and the MORNING OF SURGERY with CHG soap.  2. If you choose to wash your hair, wash your hair first as usual with your normal shampoo.  3. After shampooing, rinse your hair and body thoroughly to remove the shampoo.  4. Use CHG as you would any other liquid soap. You can apply CHG directly to the skin and wash gently with a scrungie or a clean washcloth.  5. Apply the CHG soap to your body only from the neck down. Do not use on open wounds or open sores. Avoid contact with your eyes, ears, mouth, and genitals (private parts). Wash face and genitals (private parts) with your normal soap.  6. Wash thoroughly, paying special attention to the area where your surgery will be performed.  7. Thoroughly rinse your body with warm water.  8. Do not shower/wash with your normal soap after using and rinsing off the CHG soap.  9. Pat yourself dry with a clean towel.  10. Wear clean pajamas to bed the night before surgery.  12. Place clean sheets on your bed the night of your first shower and do not sleep with pets.  13. Shower again with the CHG soap on the day of surgery prior to arriving at the hospital.  14. Do not apply any deodorants/lotions/powders.  15. Please wear clean clothes to the hospital.

## 2022-03-08 MED ORDER — ORAL CARE MOUTH RINSE: 15.0000 mL | Freq: Once | OROMUCOSAL | Status: AC

## 2022-03-08 MED ORDER — SODIUM CHLORIDE 0.9 % IV SOLN: INTRAVENOUS | Status: DC

## 2022-03-08 MED ORDER — CELECOXIB 200 MG PO CAPS: 200.0000 mg | ORAL_CAPSULE | ORAL | Status: AC

## 2022-03-08 MED ORDER — CHLORHEXIDINE GLUCONATE CLOTH 2 % EX PADS: 6.0000 | MEDICATED_PAD | Freq: Once | CUTANEOUS | Status: DC

## 2022-03-08 MED ORDER — CHLORHEXIDINE GLUCONATE 0.12 % MT SOLN: 15.0000 mL | Freq: Once | OROMUCOSAL | Status: AC

## 2022-03-08 MED ORDER — CEFAZOLIN SODIUM-DEXTROSE 2-4 GM/100ML-% IV SOLN
2.0000 g | INTRAVENOUS | Status: AC
  Administered 2022-03-09: 2 g via INTRAVENOUS

## 2022-03-08 MED ORDER — ACETAMINOPHEN 500 MG PO TABS: 1000.0000 mg | ORAL_TABLET | ORAL | Status: AC

## 2022-03-08 MED ORDER — GABAPENTIN 300 MG PO CAPS: 300.0000 mg | ORAL_CAPSULE | ORAL | Status: AC

## 2022-03-09 ENCOUNTER — Ambulatory Visit: Payer: PPO | Admitting: Urgent Care

## 2022-03-09 ENCOUNTER — Encounter: Payer: Self-pay | Admitting: Surgery

## 2022-03-09 ENCOUNTER — Ambulatory Visit: Payer: PPO | Admitting: Certified Registered"

## 2022-03-09 ENCOUNTER — Ambulatory Visit
Admission: RE | Admit: 2022-03-09 | Discharge: 2022-03-09 | Disposition: A | Discharge status: Home / Self Care | Payer: PPO | Attending: Surgery | Admitting: Surgery

## 2022-03-09 ENCOUNTER — Encounter
Admission: RE | Disposition: A | Discharge status: Home / Self Care | Payer: Self-pay | Source: Home / Self Care | Attending: Surgery

## 2022-03-09 ENCOUNTER — Other Ambulatory Visit: Payer: Self-pay

## 2022-03-09 DIAGNOSIS — L988 Other specified disorders of the skin and subcutaneous tissue: Secondary | ICD-10-CM

## 2022-03-09 DIAGNOSIS — Z833 Family history of diabetes mellitus: Secondary | ICD-10-CM | POA: Insufficient documentation

## 2022-03-09 DIAGNOSIS — L981 Factitial dermatitis: Secondary | ICD-10-CM | POA: Diagnosis not present

## 2022-03-09 DIAGNOSIS — Z8249 Family history of ischemic heart disease and other diseases of the circulatory system: Secondary | ICD-10-CM | POA: Diagnosis not present

## 2022-03-09 DIAGNOSIS — K219 Gastro-esophageal reflux disease without esophagitis: Secondary | ICD-10-CM | POA: Insufficient documentation

## 2022-03-09 DIAGNOSIS — G4730 Sleep apnea, unspecified: Secondary | ICD-10-CM | POA: Diagnosis not present

## 2022-03-09 DIAGNOSIS — I2510 Atherosclerotic heart disease of native coronary artery without angina pectoris: Secondary | ICD-10-CM | POA: Insufficient documentation

## 2022-03-09 DIAGNOSIS — L0591 Pilonidal cyst without abscess: Secondary | ICD-10-CM | POA: Diagnosis not present

## 2022-03-09 DIAGNOSIS — Z794 Long term (current) use of insulin: Secondary | ICD-10-CM | POA: Diagnosis not present

## 2022-03-09 DIAGNOSIS — E11319 Type 2 diabetes mellitus with unspecified diabetic retinopathy without macular edema: Secondary | ICD-10-CM

## 2022-03-09 DIAGNOSIS — I10 Essential (primary) hypertension: Secondary | ICD-10-CM | POA: Insufficient documentation

## 2022-03-09 DIAGNOSIS — Z7984 Long term (current) use of oral hypoglycemic drugs: Secondary | ICD-10-CM | POA: Diagnosis not present

## 2022-03-09 HISTORY — PX: PILONIDAL CYST EXCISION: SHX744

## 2022-03-09 LAB — GLUCOSE, CAPILLARY
Glucose-Capillary: 189 mg/dL — ABNORMAL HIGH (ref 70–99)
Glucose-Capillary: 179 mg/dL — ABNORMAL HIGH (ref 70–99)

## 2022-03-09 SURGERY — EXCISION, PILONIDAL CYST, EXTENSIVE
Anesthesia: General | Site: Back

## 2022-03-09 MED ORDER — GABAPENTIN 300 MG PO CAPS
ORAL_CAPSULE | ORAL | Status: AC
  Administered 2022-03-09: 300 mg via ORAL
  Filled 2022-03-09: qty 1

## 2022-03-09 MED ORDER — FENTANYL CITRATE (PF) 100 MCG/2ML IJ SOLN
INTRAMUSCULAR | Status: AC
  Filled 2022-03-09: qty 2

## 2022-03-09 MED ORDER — CHLORHEXIDINE GLUCONATE 0.12 % MT SOLN
OROMUCOSAL | Status: AC
  Administered 2022-03-09: 15 mL via OROMUCOSAL
  Filled 2022-03-09: qty 15

## 2022-03-09 MED ORDER — BUPIVACAINE LIPOSOME 1.3 % IJ SUSP
INTRAMUSCULAR | Status: AC
  Filled 2022-03-09: qty 20

## 2022-03-09 MED ORDER — SUCCINYLCHOLINE CHLORIDE 200 MG/10ML IV SOSY
PREFILLED_SYRINGE | INTRAVENOUS | Status: DC | PRN
  Administered 2022-03-09: 100 mg via INTRAVENOUS

## 2022-03-09 MED ORDER — GLYCOPYRROLATE 0.2 MG/ML IJ SOLN
INTRAMUSCULAR | Status: DC | PRN
  Administered 2022-03-09: .2 mg via INTRAVENOUS

## 2022-03-09 MED ORDER — PROPOFOL 10 MG/ML IV BOLUS
INTRAVENOUS | Status: DC | PRN
  Administered 2022-03-09: 200 mg via INTRAVENOUS

## 2022-03-09 MED ORDER — DOCUSATE SODIUM 100 MG PO CAPS: 100.0000 mg | ORAL_CAPSULE | Freq: Two times a day (BID) | ORAL | 0 refills | Status: AC | PRN

## 2022-03-09 MED ORDER — CELECOXIB 200 MG PO CAPS
ORAL_CAPSULE | ORAL | Status: AC
  Administered 2022-03-09: 200 mg via ORAL
  Filled 2022-03-09: qty 1

## 2022-03-09 MED ORDER — CEFAZOLIN SODIUM-DEXTROSE 2-4 GM/100ML-% IV SOLN
INTRAVENOUS | Status: AC
  Filled 2022-03-09: qty 100

## 2022-03-09 MED ORDER — PROPOFOL 1000 MG/100ML IV EMUL
INTRAVENOUS | Status: AC
  Filled 2022-03-09: qty 100

## 2022-03-09 MED ORDER — FENTANYL CITRATE (PF) 100 MCG/2ML IJ SOLN: 25.0000 ug | INTRAMUSCULAR | Status: DC | PRN

## 2022-03-09 MED ORDER — ACETAMINOPHEN 500 MG PO TABS
ORAL_TABLET | ORAL | Status: AC
  Administered 2022-03-09: 1000 mg via ORAL
  Filled 2022-03-09: qty 2

## 2022-03-09 MED ORDER — ONDANSETRON HCL 4 MG/2ML IJ SOLN
INTRAMUSCULAR | Status: DC | PRN
  Administered 2022-03-09 (×2): 4 mg via INTRAVENOUS

## 2022-03-09 MED ORDER — ACETAMINOPHEN 325 MG PO TABS: 650.0000 mg | ORAL_TABLET | Freq: Three times a day (TID) | ORAL | 0 refills | Status: AC | PRN

## 2022-03-09 MED ORDER — SUGAMMADEX SODIUM 200 MG/2ML IV SOLN
INTRAVENOUS | Status: DC | PRN
  Administered 2022-03-09: 300 mg via INTRAVENOUS

## 2022-03-09 MED ORDER — HYDROCODONE-ACETAMINOPHEN 5-325 MG PO TABS: 1.0000 | ORAL_TABLET | Freq: Four times a day (QID) | ORAL | 0 refills | Status: DC | PRN

## 2022-03-09 MED ORDER — BUPIVACAINE-EPINEPHRINE (PF) 0.5% -1:200000 IJ SOLN: INTRAMUSCULAR | Status: DC | PRN

## 2022-03-09 MED ORDER — FENTANYL CITRATE (PF) 100 MCG/2ML IJ SOLN
INTRAMUSCULAR | Status: DC | PRN
  Administered 2022-03-09: 50 ug via INTRAVENOUS

## 2022-03-09 MED ORDER — ROCURONIUM BROMIDE 100 MG/10ML IV SOLN
INTRAVENOUS | Status: DC | PRN
  Administered 2022-03-09: 50 mg via INTRAVENOUS
  Administered 2022-03-09: 10 mg via INTRAVENOUS

## 2022-03-09 MED ORDER — IBUPROFEN 400 MG PO TABS: 400.0000 mg | ORAL_TABLET | Freq: Three times a day (TID) | ORAL | 0 refills | Status: AC | PRN

## 2022-03-09 MED ORDER — DEXAMETHASONE SODIUM PHOSPHATE 10 MG/ML IJ SOLN
INTRAMUSCULAR | Status: DC | PRN
  Administered 2022-03-09: 5 mg via INTRAVENOUS

## 2022-03-09 MED ORDER — LIDOCAINE HCL (CARDIAC) PF 100 MG/5ML IV SOSY
PREFILLED_SYRINGE | INTRAVENOUS | Status: DC | PRN
  Administered 2022-03-09: 100 mg via INTRAVENOUS

## 2022-03-09 MED ORDER — BUPIVACAINE HCL (PF) 0.5 % IJ SOLN
INTRAMUSCULAR | Status: AC
  Filled 2022-03-09: qty 30

## 2022-03-09 MED ORDER — BUPIVACAINE-EPINEPHRINE (PF) 0.5% -1:200000 IJ SOLN
INTRAMUSCULAR | Status: DC | PRN
  Administered 2022-03-09: 20 mL

## 2022-03-09 MED ORDER — ONDANSETRON HCL 4 MG/2ML IJ SOLN: 4.0000 mg | Freq: Once | INTRAMUSCULAR | Status: DC | PRN

## 2022-03-09 MED ORDER — EPINEPHRINE PF 1 MG/ML IJ SOLN
INTRAMUSCULAR | Status: AC
  Filled 2022-03-09: qty 1

## 2022-03-09 MED ORDER — BUPIVACAINE LIPOSOME 1.3 % IJ SUSP
INTRAMUSCULAR | Status: DC | PRN
  Administered 2022-03-09: 20 mL

## 2022-03-09 SURGICAL SUPPLY — 31 items
ADH LQ OCL WTPRF AMP STRL LF (MISCELLANEOUS) ×1
ADHESIVE MASTISOL STRL (MISCELLANEOUS) ×1 IMPLANT
BLADE CLIPPER SURG (BLADE) ×1 IMPLANT
BLADE SURG SZ10 CARB STEEL (BLADE) ×1 IMPLANT
DRAPE LAPAROTOMY 100X77 ABD (DRAPES) ×1 IMPLANT
ELECT REM PT RETURN 9FT ADLT (ELECTROSURGICAL) ×1
ELECTRODE REM PT RTRN 9FT ADLT (ELECTROSURGICAL) ×1 IMPLANT
GAUZE 4X4 16PLY ~~LOC~~+RFID DBL (SPONGE) ×1 IMPLANT
GAUZE SPONGE 4X4 12PLY STRL (GAUZE/BANDAGES/DRESSINGS) ×1 IMPLANT
GLOVE BIOGEL PI IND STRL 7.0 (GLOVE) ×1 IMPLANT
GLOVE SURG SYN 6.5 ES PF (GLOVE) ×1 IMPLANT
GLOVE SURG SYN 6.5 PF PI (GLOVE) ×1 IMPLANT
GOWN STRL REUS W/ TWL LRG LVL3 (GOWN DISPOSABLE) ×2 IMPLANT
GOWN STRL REUS W/TWL LRG LVL3 (GOWN DISPOSABLE) ×2
KIT TURNOVER KIT A (KITS) ×1 IMPLANT
MANIFOLD NEPTUNE II (INSTRUMENTS) ×1 IMPLANT
NEEDLE HYPO 22GX1.5 SAFETY (NEEDLE) ×1 IMPLANT
NS IRRIG 500ML POUR BTL (IV SOLUTION) ×1 IMPLANT
PACK BASIN MINOR ARMC (MISCELLANEOUS) ×1 IMPLANT
SOL PREP PVP 2OZ (MISCELLANEOUS) ×1
SOLUTION PREP PVP 2OZ (MISCELLANEOUS) ×1 IMPLANT
SUT MNCRL 4-0 (SUTURE) ×1
SUT MNCRL 4-0 27XMFL (SUTURE) ×1
SUT VIC AB 3-0 SH 27 (SUTURE) ×1
SUT VIC AB 3-0 SH 27X BRD (SUTURE) ×1 IMPLANT
SUTURE MNCRL 4-0 27XMF (SUTURE) ×1 IMPLANT
SYR 10ML LL (SYRINGE) ×1 IMPLANT
SYR 20ML LL LF (SYRINGE) ×1 IMPLANT
SYR BULB IRRIG 60ML STRL (SYRINGE) ×1 IMPLANT
TAPE CLOTH 3X10 WHT NS LF (GAUZE/BANDAGES/DRESSINGS) ×1 IMPLANT
TRAP FLUID SMOKE EVACUATOR (MISCELLANEOUS) ×1 IMPLANT

## 2022-03-09 NOTE — Anesthesia Procedure Notes (Signed)
Procedure Name: Intubation Date/Time: 03/09/2022 7:35 AM  Performed by: Kelton Pillar, CRNAPre-anesthesia Checklist: Patient identified, Emergency Drugs available, Suction available and Patient being monitored Patient Re-evaluated:Patient Re-evaluated prior to induction Oxygen Delivery Method: Circle system utilized Preoxygenation: Pre-oxygenation with 100% oxygen Induction Type: IV induction Ventilation: Mask ventilation without difficulty Laryngoscope Size: McGraph and 3 Grade View: Grade I Tube type: Oral Number of attempts: 1 Airway Equipment and Method: Stylet and Oral airway Placement Confirmation: ETT inserted through vocal cords under direct vision, positive ETCO2, breath sounds checked- equal and bilateral and CO2 detector Secured at: 21 cm Tube secured with: Tape Dental Injury: Teeth and Oropharynx as per pre-operative assessment

## 2022-03-09 NOTE — Anesthesia Preprocedure Evaluation (Signed)
Anesthesia Evaluation  Patient identified by MRN, date of birth, ID band Patient awake    Reviewed: Allergy & Precautions, H&P , NPO status , Patient's Chart, lab work & pertinent test results, reviewed documented beta blocker date and time   History of Anesthesia Complications Negative for: history of anesthetic complications  Airway Mallampati: II  TM Distance: >3 FB Neck ROM: full    Dental  (+) Dental Advidsory Given, Caps   Pulmonary neg shortness of breath, sleep apnea and Continuous Positive Airway Pressure Ventilation , neg COPD, neg recent URI   Pulmonary exam normal breath sounds clear to auscultation       Cardiovascular Exercise Tolerance: Good hypertension, (-) angina + CAD  (-) Past MI and (-) Cardiac Stents Normal cardiovascular exam(-) dysrhythmias (-) Valvular Problems/Murmurs Rhythm:regular Rate:Normal     Neuro/Psych negative neurological ROS  negative psych ROS   GI/Hepatic Neg liver ROS,GERD  ,,  Endo/Other  diabetes    Renal/GU negative Renal ROS  negative genitourinary   Musculoskeletal   Abdominal   Peds  Hematology negative hematology ROS (+)   Anesthesia Other Findings Past Medical History: No date: Anemia No date: BPH (benign prostatic hypertrophy) 04/2020: COVID-19 No date: Diabetes mellitus (HCC) No date: 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) No date: ED (erectile dysfunction) No date: GERD (gastroesophageal reflux disease) No date: Heart disease No date: History of kidney stones No date: HLD (hyperlipidemia) No date: HTN (hypertension) No date: Over weight No date: Sleep apnea     Comment:  uses cpap   Reproductive/Obstetrics negative OB ROS                             Anesthesia Physical Anesthesia Plan  ASA: 3  Anesthesia Plan: General   Post-op Pain Management:    Induction: Intravenous  PONV Risk Score  and Plan: 2 and Ondansetron, Dexamethasone and Treatment may vary due to age or medical condition  Airway Management Planned: Oral ETT and LMA  Additional Equipment:   Intra-op Plan:   Post-operative Plan: Extubation in OR  Informed Consent: I have reviewed the patients History and Physical, chart, labs and discussed the procedure including the risks, benefits and alternatives for the proposed anesthesia with the patient or authorized representative who has indicated his/her understanding and acceptance.     Dental Advisory Given  Plan Discussed with: Anesthesiologist, CRNA and Surgeon  Anesthesia Plan Comments:        Anesthesia Quick Evaluation

## 2022-03-09 NOTE — Op Note (Signed)
Pre-Op Dx: pilonidal disease Post-Op Dx: same Anesthesia: GETA EBL: [id number] Complications:  none apparent Specimen: none Procedure: pilonidal cystectomy   Surgeon: Lysle Pearl  Indications for procedure: See H&P  Description of Procedure:  Consent obtained, time out performed.  Patient placed in prone position.  Area sterilized and draped in usual position.  Elliptical shaped incision made around former wound cavity site which is essentially healed today.  Dissection carried down to subcutaneous fat where there was noted to be healthy fatty tissue and the diseased portion removed completely from surrounding tissue passed off field pending pathology.  Inspection of the wound base noted tracking of additional scar tissue and strands of hair extending towards the midline sinus which is also closed completely today.  Additional scar tissue and hair was removed completely until healthy fat noted.  With the overall lack of obvious infection and no additional abnormal tissue extending all the way to the former midline sinus opening, decision was made to close the wound primarily to facilitate healing.  Wound hemostasis noted, exparel infused around wound.  Wound then closed in 2 layer fashion using interrupted 3-0 monocryl for deep dermal layer.  Running 4-0 monocryl used to approximate the skin in a subcuticular fashion. Wound then dressed with Dermabond.  Pt tolerated procedure well, and transferred to PACU in stable condition. Sponge and instrument count correct at end of procedure.

## 2022-03-09 NOTE — Discharge Instructions (Addendum)
Removal, Care After This sheet gives you information about how to care for yourself after your procedure. Your health care provider may also give you more specific instructions. If you have problems or questions, contact your health care provider. What can I expect after the procedure? After the procedure, it is common to have: Soreness. Bruising. Itching. Follow these instructions at home: site care Follow instructions from your health care provider about how to take care of your site. Make sure you: Wash your hands with soap and water before and after you change your bandage (dressing). If soap and water are not available, use hand sanitizer. Leave stitches (sutures), skin glue, or adhesive strips in place. These skin closures may need to stay in place for 2 weeks or longer. If adhesive strip edges start to loosen and curl up, you may trim the loose edges. Do not remove adhesive strips completely unless your health care provider tells you to do that. If the area bleeds or bruises, apply gentle pressure for 10 minutes. OK TO SHOWER IN 24HRS  Check your site every day for signs of infection. Check for: Redness, swelling, or pain. Fluid or blood. Warmth. Pus or a bad smell.  General instructions Rest and then return to your normal activities as told by your health care provider.  tylenol and advil as needed for discomfort.  Please alternate between the two every four hours as needed for pain.    Use narcotics, if prescribed, only when tylenol and motrin is not enough to control pain.  325-691m every 8hrs to max of 30078m24hrs (including the 32534mn every norco dose) for the tylenol.    Advil up to 400m22mr dose every 8hrs as needed for pain.   Keep all follow-up visits as told by your health care provider. This is important. Contact a health care provider if: You have redness, swelling, or pain around your site. You have fluid or blood coming from your site. Your site feels warm to  the touch. You have pus or a bad smell coming from your site. You have a fever. Your sutures, skin glue, or adhesive strips loosen or come off sooner than expected. Get help right away if: You have bleeding that does not stop with pressure or a dressing. Summary After the procedure, it is common to have some soreness, bruising, and itching at the site. Follow instructions from your health care provider about how to take care of your site. Check your site every day for signs of infection. Contact a health care provider if you have redness, swelling, or pain around your site, or your site feels warm to the touch. Keep all follow-up visits as told by your health care provider. This is important. This information is not intended to replace advice given to you by your health care provider. Make sure you discuss any questions you have with your health care provider. Document Released: 02/04/2015 Document Revised: 07/08/2017 Document Reviewed: 07/08/2017 Elsevier Interactive Patient Education  2019 [place] drugs that you were given will stay in your system until tomorrow so for the next 24 hours you should not:  Drive an automobile Make any legal decisions Drink any alcoholic beverage   You may resume regular meals tomorrow.  Today it is better to start with liquids and gradually work up to solid foods.  You may eat anything you prefer, but it is better to start with liquids, then soup and crackers, and gradually  work up to [employer].   Please notify your doctor immediately if you have any unusual bleeding, trouble breathing, redness and pain at the surgery site, drainage, fever, or pain not relieved by medication.    Additional Instructions: PLEASE LEAVE GREEN/TEAL BRACELET ON FOR 4 DAYS        Please contact your physician with any problems or Same Day Surgery at [phone], Monday through Friday 6 am to 4 pm, or [facility] number at [phone].

## 2022-03-09 NOTE — Interval H&P Note (Signed)
No change. OK to proceed. Patient verbalized wife will be performing the daily wet to dry dressing changes likely needed

## 2022-03-09 NOTE — Transfer of Care (Signed)
Immediate Anesthesia Transfer of Care Note  Patient: Shawn Stevens  Procedure(s) Performed: CYST EXCISION PILONIDAL EXTENSIVE (Back)  Patient Location: PACU  Anesthesia Type:General  Level of Consciousness: awake, drowsy, and patient cooperative  Airway & Oxygen Therapy: Patient Spontanous Breathing and Patient connected to face mask oxygen  Post-op Assessment: Report given to RN and Post -op Vital signs reviewed and stable  Post vital signs: Reviewed and stable  Last Vitals:  Vitals Value Taken Time  BP 158/75 03/09/22 0819  Temp 36.8 C 03/09/22 0819  Pulse 77 03/09/22 0821  Resp 26 03/09/22 0821  SpO2 99 % 03/09/22 0821  Vitals shown include unvalidated device data.  Last Pain:  Vitals:   03/09/22 0819  TempSrc:   PainSc: 0-No pain         Complications: No notable events documented.

## 2022-03-12 ENCOUNTER — Encounter: Payer: Self-pay | Admitting: Surgery

## 2022-03-12 LAB — SURGICAL PATHOLOGY

## 2022-03-15 DIAGNOSIS — E1122 Type 2 diabetes mellitus with diabetic chronic kidney disease: Secondary | ICD-10-CM | POA: Diagnosis not present

## 2022-03-15 DIAGNOSIS — E669 Obesity, unspecified: Secondary | ICD-10-CM | POA: Diagnosis not present

## 2022-03-15 DIAGNOSIS — I10 Essential (primary) hypertension: Secondary | ICD-10-CM | POA: Diagnosis not present

## 2022-03-15 DIAGNOSIS — D509 Iron deficiency anemia, unspecified: Secondary | ICD-10-CM | POA: Diagnosis not present

## 2022-03-15 DIAGNOSIS — G4733 Obstructive sleep apnea (adult) (pediatric): Secondary | ICD-10-CM | POA: Diagnosis not present

## 2022-03-15 DIAGNOSIS — N183 Chronic kidney disease, stage 3 unspecified: Secondary | ICD-10-CM | POA: Diagnosis not present

## 2022-03-15 DIAGNOSIS — Z794 Long term (current) use of insulin: Secondary | ICD-10-CM | POA: Diagnosis not present

## 2022-03-15 DIAGNOSIS — E785 Hyperlipidemia, unspecified: Secondary | ICD-10-CM | POA: Diagnosis not present

## 2022-03-15 DIAGNOSIS — E1169 Type 2 diabetes mellitus with other specified complication: Secondary | ICD-10-CM | POA: Diagnosis not present

## 2022-03-15 DIAGNOSIS — G8929 Other chronic pain: Secondary | ICD-10-CM | POA: Diagnosis not present

## 2022-03-15 DIAGNOSIS — E559 Vitamin D deficiency, unspecified: Secondary | ICD-10-CM | POA: Diagnosis not present

## 2022-03-15 DIAGNOSIS — E538 Deficiency of other specified B group vitamins: Secondary | ICD-10-CM | POA: Diagnosis not present

## 2022-03-15 NOTE — Anesthesia Postprocedure Evaluation (Signed)
Anesthesia Post Note  Patient: Shawn Stevens  Procedure(s) Performed: CYST EXCISION PILONIDAL EXTENSIVE (Back)  Patient location during evaluation: PACU Anesthesia Type: General Level of consciousness: awake and alert Pain management: pain level controlled Vital Signs Assessment: post-procedure vital signs reviewed and stable Respiratory status: spontaneous breathing, nonlabored ventilation, respiratory function stable and patient connected to nasal cannula oxygen Cardiovascular status: blood pressure returned to baseline and stable Postop Assessment: no apparent nausea or vomiting Anesthetic complications: no   No notable events documented.   Last Vitals:  Vitals:   03/09/22 0845 03/09/22 0916  BP: 122/70 133/69  Pulse: 67 72  Resp: 13 18  Temp: (!) 36.2 C 36.4 C  SpO2: 95% 98%    Last Pain:  Vitals:   03/10/22 1025  TempSrc:   PainSc: 0-No pain                 Martha Clan

## 2022-06-20 DIAGNOSIS — Z86010 Personal history of colonic polyps: Secondary | ICD-10-CM | POA: Diagnosis not present

## 2022-06-20 DIAGNOSIS — Z09 Encounter for follow-up examination after completed treatment for conditions other than malignant neoplasm: Secondary | ICD-10-CM | POA: Diagnosis not present

## 2022-08-22 DIAGNOSIS — L570 Actinic keratosis: Secondary | ICD-10-CM | POA: Diagnosis not present

## 2022-08-22 DIAGNOSIS — L578 Other skin changes due to chronic exposure to nonionizing radiation: Secondary | ICD-10-CM | POA: Diagnosis not present

## 2022-08-22 DIAGNOSIS — L821 Other seborrheic keratosis: Secondary | ICD-10-CM | POA: Diagnosis not present

## 2022-08-29 DIAGNOSIS — E782 Mixed hyperlipidemia: Secondary | ICD-10-CM | POA: Diagnosis not present

## 2022-08-29 DIAGNOSIS — Z794 Long term (current) use of insulin: Secondary | ICD-10-CM | POA: Diagnosis not present

## 2022-08-29 DIAGNOSIS — G4733 Obstructive sleep apnea (adult) (pediatric): Secondary | ICD-10-CM | POA: Diagnosis not present

## 2022-08-29 DIAGNOSIS — Z79899 Other long term (current) drug therapy: Secondary | ICD-10-CM | POA: Diagnosis not present

## 2022-08-29 DIAGNOSIS — E113293 Type 2 diabetes mellitus with mild nonproliferative diabetic retinopathy without macular edema, bilateral: Secondary | ICD-10-CM | POA: Diagnosis not present

## 2022-08-29 DIAGNOSIS — I10 Essential (primary) hypertension: Secondary | ICD-10-CM | POA: Diagnosis not present

## 2022-08-29 DIAGNOSIS — K219 Gastro-esophageal reflux disease without esophagitis: Secondary | ICD-10-CM | POA: Diagnosis not present

## 2022-09-17 ENCOUNTER — Encounter: Payer: Self-pay | Admitting: *Deleted

## 2022-09-18 ENCOUNTER — Ambulatory Visit
Admission: RE | Admit: 2022-09-18 | Discharge: 2022-09-18 | Disposition: A | Discharge status: Home / Self Care | Payer: PPO | Attending: Gastroenterology | Admitting: Gastroenterology

## 2022-09-18 ENCOUNTER — Ambulatory Visit: Payer: PPO | Admitting: Certified Registered"

## 2022-09-18 ENCOUNTER — Encounter
Admission: RE | Disposition: A | Discharge status: Home / Self Care | Payer: Self-pay | Source: Home / Self Care | Attending: Gastroenterology

## 2022-09-18 ENCOUNTER — Encounter: Payer: Self-pay | Admitting: *Deleted

## 2022-09-18 DIAGNOSIS — N400 Enlarged prostate without lower urinary tract symptoms: Secondary | ICD-10-CM | POA: Insufficient documentation

## 2022-09-18 DIAGNOSIS — Z86010 Personal history of colonic polyps: Secondary | ICD-10-CM | POA: Diagnosis not present

## 2022-09-18 DIAGNOSIS — K649 Unspecified hemorrhoids: Secondary | ICD-10-CM | POA: Diagnosis not present

## 2022-09-18 DIAGNOSIS — Z8616 Personal history of COVID-19: Secondary | ICD-10-CM | POA: Diagnosis not present

## 2022-09-18 DIAGNOSIS — K640 First degree hemorrhoids: Secondary | ICD-10-CM | POA: Insufficient documentation

## 2022-09-18 DIAGNOSIS — Z7984 Long term (current) use of oral hypoglycemic drugs: Secondary | ICD-10-CM | POA: Insufficient documentation

## 2022-09-18 DIAGNOSIS — K635 Polyp of colon: Secondary | ICD-10-CM | POA: Insufficient documentation

## 2022-09-18 DIAGNOSIS — E11319 Type 2 diabetes mellitus with unspecified diabetic retinopathy without macular edema: Secondary | ICD-10-CM | POA: Diagnosis not present

## 2022-09-18 DIAGNOSIS — E785 Hyperlipidemia, unspecified: Secondary | ICD-10-CM | POA: Insufficient documentation

## 2022-09-18 DIAGNOSIS — E669 Obesity, unspecified: Secondary | ICD-10-CM | POA: Insufficient documentation

## 2022-09-18 DIAGNOSIS — G4733 Obstructive sleep apnea (adult) (pediatric): Secondary | ICD-10-CM | POA: Diagnosis not present

## 2022-09-18 DIAGNOSIS — D649 Anemia, unspecified: Secondary | ICD-10-CM | POA: Diagnosis not present

## 2022-09-18 DIAGNOSIS — Z794 Long term (current) use of insulin: Secondary | ICD-10-CM | POA: Diagnosis not present

## 2022-09-18 DIAGNOSIS — K219 Gastro-esophageal reflux disease without esophagitis: Secondary | ICD-10-CM | POA: Diagnosis not present

## 2022-09-18 DIAGNOSIS — Z1211 Encounter for screening for malignant neoplasm of colon: Secondary | ICD-10-CM | POA: Diagnosis not present

## 2022-09-18 DIAGNOSIS — I2510 Atherosclerotic heart disease of native coronary artery without angina pectoris: Secondary | ICD-10-CM | POA: Diagnosis not present

## 2022-09-18 DIAGNOSIS — I10 Essential (primary) hypertension: Secondary | ICD-10-CM | POA: Insufficient documentation

## 2022-09-18 HISTORY — PX: COLONOSCOPY: SHX5424

## 2022-09-18 HISTORY — PX: POLYPECTOMY: SHX149

## 2022-09-18 LAB — GLUCOSE, CAPILLARY: Glucose-Capillary: 98 mg/dL (ref 70–99)

## 2022-09-18 SURGERY — COLONOSCOPY
Anesthesia: General

## 2022-09-18 MED ORDER — SODIUM CHLORIDE 0.9 % IV SOLN: INTRAVENOUS | Status: DC

## 2022-09-18 MED ORDER — EPHEDRINE SULFATE (PRESSORS) 50 MG/ML IJ SOLN
INTRAMUSCULAR | Status: DC | PRN
  Administered 2022-09-18 (×2): 5 mg via INTRAVENOUS

## 2022-09-18 MED ORDER — PROPOFOL 10 MG/ML IV BOLUS
INTRAVENOUS | Status: DC | PRN
  Administered 2022-09-18: 150 ug/kg/min via INTRAVENOUS
  Administered 2022-09-18: 50 mg via INTRAVENOUS

## 2022-09-18 MED ORDER — LIDOCAINE HCL (CARDIAC) PF 100 MG/5ML IV SOSY
PREFILLED_SYRINGE | INTRAVENOUS | Status: DC | PRN
  Administered 2022-09-18: 100 mg via INTRAVENOUS

## 2022-09-18 NOTE — Transfer of Care (Signed)
Immediate Anesthesia Transfer of Care Note  Patient: Shawn Stevens  Procedure(s) Performed: COLONOSCOPY POLYPECTOMY INTESTINAL  Patient Location: PACU  Anesthesia Type:General  Level of Consciousness: awake and alert   Airway & Oxygen Therapy: Patient Spontanous Breathing and Patient connected to nasal cannula oxygen  Post-op Assessment: Report given to RN and Post -op Vital signs reviewed and stable  Post vital signs: stable  Last Vitals:  Vitals Value Taken Time  BP 102/57 09/18/22 0925  Temp 35.7 C 09/18/22 0925  Pulse 64 09/18/22 0925  Resp 13 09/18/22 0925  SpO2 95 % 09/18/22 0925    Last Pain:  Vitals:   09/18/22 0925  TempSrc: Temporal  PainSc: 0-No pain         Complications: No notable events documented.

## 2022-09-18 NOTE — Interval H&P Note (Signed)
History and Physical Interval Note:  09/18/2022 8:50 AM  Shawn Stevens  has presented today for surgery, with the diagnosis of hx of adenomatous polyp of colon.  The various methods of treatment have been discussed with the patient and family. After consideration of risks, benefits and other options for treatment, the patient has consented to  Procedure(s): COLONOSCOPY (N/A) as a surgical intervention.  The patient's history has been reviewed, patient examined, no change in status, stable for surgery.  I have reviewed the patient's chart and labs.  Questions were answered to the patient's satisfaction.     Regis Bill  Ok to proceed with colonoscopy

## 2022-09-18 NOTE — Op Note (Signed)
[facility] Gastroenterology Patient Name: Shawn Stevens Procedure Date: 09/18/2022 8:38 AM MRN: [id number] Account #: [id number] Date of Birth: 01-20-1951 Admit Type: Outpatient Age: 72 Room: [facility] ENDO ROOM 1 Gender: Male Note Status: Finalized Instrument Name: Prentice Docker [id number] Procedure:             Colonoscopy Indications:           High risk colon cancer surveillance: Personal history                         of colonic polyps Providers:             Eather Colas MD, MD Referring MD:          Caryl Asp (Referring MD) Medicines:             Monitored Anesthesia Care Complications:         No immediate complications. Estimated blood loss:                         Minimal. Procedure:             Pre-Anesthesia Assessment:                        - Prior to the procedure, a History and Physical was                         performed, and patient medications and allergies were                         reviewed. The patient is competent. The risks and                         benefits of the procedure and the sedation options and                         risks were discussed with the patient. All questions                         were answered and informed consent was obtained.                         Patient identification and proposed procedure were                         verified by the physician, the nurse, the                         anesthesiologist, the anesthetist and the technician                         in the endoscopy suite. Mental Status Examination:                         alert and oriented. Airway Examination: normal                         oropharyngeal airway and neck mobility. Respiratory  Examination: clear to auscultation. CV Examination:                         normal. Prophylactic Antibiotics: The patient does not                         require prophylactic antibiotics. Prior                          Anticoagulants: The patient has taken no anticoagulant                         or antiplatelet agents. ASA Grade Assessment: III - A                         patient with severe systemic disease. After reviewing                         the risks and benefits, the patient was deemed in                         satisfactory condition to undergo the procedure. The                         anesthesia plan was to use monitored anesthesia care                         (MAC). Immediately prior to administration of                         medications, the patient was re-assessed for adequacy                         to receive sedatives. The heart rate, respiratory                         rate, oxygen saturations, blood pressure, adequacy of                         pulmonary ventilation, and response to care were                         monitored throughout the procedure. The physical                         status of the patient was re-assessed after the                         procedure.                        After obtaining informed consent, the colonoscope was                         passed under direct vision. Throughout the procedure,                         the patient's blood pressure, pulse, and oxygen  saturations were monitored continuously. The                         Colonoscope was introduced through the anus and                         advanced to the the cecum, identified by appendiceal                         orifice and ileocecal valve. The colonoscopy was                         somewhat difficult due to significant looping.                         Successful completion of the procedure was aided by                         applying abdominal pressure. The patient tolerated the                         procedure well. The quality of the bowel preparation                         was good. The ileocecal valve, appendiceal orifice,                         and rectum  were photographed. Findings:      The perianal and digital rectal examinations were normal.      A 4 mm polyp was found in the ascending colon. The polyp was sessile.       The polyp was removed with a cold snare. Resection and retrieval were       complete. Estimated blood loss was minimal.      Internal hemorrhoids were found during retroflexion. The hemorrhoids       were Grade I (internal hemorrhoids that do not prolapse).      The exam was otherwise without abnormality on direct and retroflexion       views. Impression:            - One 4 mm polyp in the ascending colon, removed with                         a cold snare. Resected and retrieved.                        - Internal hemorrhoids.                        - The examination was otherwise normal on direct and                         retroflexion views. Recommendation:        - Discharge patient to home.                        - Resume previous diet.                        - Continue present  medications.                        - Await pathology results.                        - Repeat colonoscopy in 7-10 years for surveillance.                        - Return to referring physician as previously                         scheduled. Procedure Code(s):     --- Professional ---                        [phone], Colonoscopy, flexible; with removal of                         tumor(s), polyp(s), or other lesion(s) by snare                         technique Diagnosis Code(s):     --- Professional ---                        Z86.010, Personal history of colonic polyps                        D12.2, Benign neoplasm of ascending colon                        K64.0, First degree hemorrhoids CPT copyright 2022 American Medical Association. All rights reserved. The codes documented in this report are preliminary and upon coder review may  be revised to meet current compliance requirements. Eather Colas MD, MD 09/18/2022 9:27:37 AM Number of  Addenda: 0 Note Initiated On: 09/18/2022 8:38 AM Scope Withdrawal Time: 0 hours 17 minutes 2 seconds  Total Procedure Duration: 0 hours 22 minutes 55 seconds  Estimated Blood Loss:  Estimated blood loss was minimal.      [facility]

## 2022-09-18 NOTE — Anesthesia Preprocedure Evaluation (Signed)
Anesthesia Evaluation  Patient identified by MRN, date of birth, ID band Patient awake    Reviewed: Allergy & Precautions, H&P , NPO status , Patient's Chart, lab work & pertinent test results, reviewed documented beta blocker date and time   History of Anesthesia Complications Negative for: history of anesthetic complications  Airway Mallampati: II  TM Distance: >3 FB Neck ROM: full    Dental  (+) Dental Advidsory Given, Caps   Pulmonary neg shortness of breath, sleep apnea and Continuous Positive Airway Pressure Ventilation , neg COPD, neg recent URI   Pulmonary exam normal breath sounds clear to auscultation       Cardiovascular Exercise Tolerance: Good hypertension, (-) angina + CAD  (-) Past MI and (-) Cardiac Stents Normal cardiovascular exam(-) dysrhythmias (-) Valvular Problems/Murmurs Rhythm:regular Rate:Normal     Neuro/Psych negative neurological ROS  negative psych ROS   GI/Hepatic Neg liver ROS,GERD  ,,  Endo/Other  diabetes    Renal/GU negative Renal ROS  negative genitourinary   Musculoskeletal   Abdominal   Peds  Hematology negative hematology ROS (+)   Anesthesia Other Findings Past Medical History: No date: Anemia No date: BPH (benign prostatic hypertrophy) 04/2020: COVID-19 No date: Diabetes mellitus (HCC) No date: 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) No date: ED (erectile dysfunction) No date: GERD (gastroesophageal reflux disease) No date: Heart disease No date: History of kidney stones No date: HLD (hyperlipidemia) No date: HTN (hypertension) No date: Over weight No date: Sleep apnea     Comment:  uses cpap   Reproductive/Obstetrics negative OB ROS                             Anesthesia Physical Anesthesia Plan  ASA: 3  Anesthesia Plan: General   Post-op Pain Management:    Induction: Intravenous  PONV Risk Score  and Plan: 2 and Propofol infusion and TIVA  Airway Management Planned: Natural Airway and Nasal Cannula  Additional Equipment:   Intra-op Plan:   Post-operative Plan: Extubation in OR  Informed Consent: I have reviewed the patients History and Physical, chart, labs and discussed the procedure including the risks, benefits and alternatives for the proposed anesthesia with the patient or authorized representative who has indicated his/her understanding and acceptance.     Dental Advisory Given  Plan Discussed with: Anesthesiologist, CRNA and Surgeon  Anesthesia Plan Comments:        Anesthesia Quick Evaluation

## 2022-09-18 NOTE — H&P (Signed)
Outpatient short stay form Pre-procedure 09/18/2022  Regis Bill, MD  Primary Physician: Myrene Buddy, NP  Reason for visit:  Surveillance colonoscopy  History of present illness:    72 y/o gentleman with history of hypertension, obesity, OSA, and DM II here for surveillance colonoscopy. Last colonoscopy in 2019 was unremarkable. No blood thinners except aspirin. No significant abdominal surgeries. No first degree relatives with GI malignancies.    Current Facility-Administered Medications:    0.9 %  sodium chloride infusion, , Intravenous, Continuous, Kalifa Cadden, Rossie Muskrat, MD, Last Rate: 20 mL/hr at 09/18/22 0847, Continued from Pre-op at 09/18/22 0847  Medications Prior to Admission  Medication Sig Dispense Refill Last Dose   losartan (COZAAR) 50 MG tablet Take 50 mg by mouth daily. Start when finished with lisinopril rx   09/18/2022   aspirin EC 81 MG tablet Take 81 mg by mouth in the morning. Swallow whole.   09/14/2022   Cholecalciferol 25 MCG (1000 UT) tablet Take 1,000 Units by mouth in the morning.      ferrous sulfate 325 (65 FE) MG tablet Take 325 mg by mouth daily with breakfast.       glucose blood test strip Use 1 strip via meter twice daily as directed.      HYDROcodone-acetaminophen (NORCO) 5-325 MG tablet Take 1 tablet by mouth every 6 (six) hours as needed for up to 6 doses for moderate pain. 6 tablet 0    insulin NPH-regular Human (NOVOLIN 70/30) (70-30) 100 UNIT/ML injection Inject 55 Units into the skin 2 (two) times daily with a meal.      Insulin Syringe-Needle U-100 (INSULIN SYRINGE .5CC/31GX5/16") 31G X 5/16" 0.5 ML MISC as directed (twice daily insulin dosing.)      lisinopril (PRINIVIL,ZESTRIL) 20 MG tablet Take 20 mg by mouth in the morning. (Patient not taking: Reported on 09/18/2022)   Not Taking   Magnesium 250 MG TABS Take 250 mg by mouth in the morning.      metFORMIN (GLUCOPHAGE) 1000 MG tablet Take 1,000 mg by mouth 2 (two) times daily with a  meal.      omeprazole (PRILOSEC) 20 MG capsule Take 20 mg by mouth every other day. In the morning      pyridoxine (B-6) 100 MG tablet Take 100 mg by mouth in the morning.      simvastatin (ZOCOR) 10 MG tablet Take 5 mg by mouth every evening.      tamsulosin (FLOMAX) 0.4 MG CAPS capsule Take 0.4 mg by mouth every evening.      vitamin B-12 (CYANOCOBALAMIN) 500 MCG tablet Take 500 mcg by mouth in the morning.        No Known Allergies   Past Medical History:  Diagnosis Date   Anemia    BPH (benign prostatic hypertrophy)    COVID-19 04/2020   Diabetes mellitus (HCC)    Diabetic retinopathy associated with type 2 diabetes mellitus (HCC)    ED (erectile dysfunction)    GERD (gastroesophageal reflux disease)    Heart disease    History of kidney stones    HLD (hyperlipidemia)    HTN (hypertension)    Over weight    Sleep apnea    uses cpap    Review of systems:  Otherwise negative.    Physical Exam  Gen: Alert, oriented. Appears stated age.  HEENT: PERRLA. Lungs: No respiratory distress CV: RRR Abd: soft, benign, no masses Ext: No edema    Planned procedures: Proceed with colonoscopy. The  patient understands the nature of the planned procedure, indications, risks, alternatives and potential complications including but not limited to bleeding, infection, perforation, damage to internal organs and possible oversedation/side effects from anesthesia. The patient agrees and gives consent to proceed.  Please refer to procedure notes for findings, recommendations and patient disposition/instructions.     Regis Bill, MD [facility]

## 2022-09-19 ENCOUNTER — Encounter: Payer: Self-pay | Admitting: Gastroenterology

## 2022-09-19 NOTE — Anesthesia Postprocedure Evaluation (Signed)
Anesthesia Post Note  Patient: Shawn Stevens  Procedure(s) Performed: COLONOSCOPY POLYPECTOMY INTESTINAL  Patient location during evaluation: Endoscopy Anesthesia Type: General Level of consciousness: awake and alert Pain management: pain level controlled Vital Signs Assessment: post-procedure vital signs reviewed and stable Respiratory status: spontaneous breathing, nonlabored ventilation, respiratory function stable and patient connected to nasal cannula oxygen Cardiovascular status: blood pressure returned to baseline and stable Postop Assessment: no apparent nausea or vomiting Anesthetic complications: no   No notable events documented.   Last Vitals:  Vitals:   09/18/22 0925 09/18/22 0935  BP: (!) 102/57 (!) 117/49  Pulse: 64 61  Resp: 13 (!) 23  Temp: (!) 35.7 C   SpO2: 95% 97%    Last Pain:  Vitals:   09/18/22 0935  TempSrc:   PainSc: 0-No pain                 Lenard Simmer

## 2022-10-25 DIAGNOSIS — M9901 Segmental and somatic dysfunction of cervical region: Secondary | ICD-10-CM | POA: Diagnosis not present

## 2022-10-25 DIAGNOSIS — M5441 Lumbago with sciatica, right side: Secondary | ICD-10-CM | POA: Diagnosis not present

## 2022-10-25 DIAGNOSIS — M531 Cervicobrachial syndrome: Secondary | ICD-10-CM | POA: Diagnosis not present

## 2022-10-25 DIAGNOSIS — M955 Acquired deformity of pelvis: Secondary | ICD-10-CM | POA: Diagnosis not present

## 2022-10-25 DIAGNOSIS — M9905 Segmental and somatic dysfunction of pelvic region: Secondary | ICD-10-CM | POA: Diagnosis not present

## 2022-10-25 DIAGNOSIS — M9903 Segmental and somatic dysfunction of lumbar region: Secondary | ICD-10-CM | POA: Diagnosis not present

## 2022-10-31 DIAGNOSIS — M9905 Segmental and somatic dysfunction of pelvic region: Secondary | ICD-10-CM | POA: Diagnosis not present

## 2022-10-31 DIAGNOSIS — M9901 Segmental and somatic dysfunction of cervical region: Secondary | ICD-10-CM | POA: Diagnosis not present

## 2022-10-31 DIAGNOSIS — M955 Acquired deformity of pelvis: Secondary | ICD-10-CM | POA: Diagnosis not present

## 2022-10-31 DIAGNOSIS — M9903 Segmental and somatic dysfunction of lumbar region: Secondary | ICD-10-CM | POA: Diagnosis not present

## 2022-10-31 DIAGNOSIS — M531 Cervicobrachial syndrome: Secondary | ICD-10-CM | POA: Diagnosis not present

## 2022-10-31 DIAGNOSIS — M5441 Lumbago with sciatica, right side: Secondary | ICD-10-CM | POA: Diagnosis not present

## 2022-11-06 DIAGNOSIS — M9905 Segmental and somatic dysfunction of pelvic region: Secondary | ICD-10-CM | POA: Diagnosis not present

## 2022-11-06 DIAGNOSIS — M9901 Segmental and somatic dysfunction of cervical region: Secondary | ICD-10-CM | POA: Diagnosis not present

## 2022-11-06 DIAGNOSIS — M531 Cervicobrachial syndrome: Secondary | ICD-10-CM | POA: Diagnosis not present

## 2022-11-06 DIAGNOSIS — M955 Acquired deformity of pelvis: Secondary | ICD-10-CM | POA: Diagnosis not present

## 2022-11-06 DIAGNOSIS — M5441 Lumbago with sciatica, right side: Secondary | ICD-10-CM | POA: Diagnosis not present

## 2022-11-06 DIAGNOSIS — M9903 Segmental and somatic dysfunction of lumbar region: Secondary | ICD-10-CM | POA: Diagnosis not present

## 2023-01-09 NOTE — Progress Notes (Signed)
01/11/2023 9:24 AM   Shawn Stevens [birth year] [id number]  Referring provider: Myrene Buddy, NP [address] [place],  [place] [address]  Urological history: 1. BPH with LU TS -PSA pending  2. ED -contributing factors of age,  BPH, DM, HTN, HLD and sleep apnea -Intolerable to PDE 5 inhibitors -Not interested in further treatments or management  3. Nephrolithiasis -left URS 2022-5 mm ureteral stone -Passed a stone spontaneously in the 1980s  HPI: Shawn Stevens is a 72 y.o. male who presents today for 1 year follow-up.  Previous records reviewed.     I PSS 19/2  He will have an occasional weak urinary stream and some postvoid dribbling.  This is not bothersome to him.  He states he is taking the tamsulosin 0.4 mg daily.  He is at goal with this medication.  Patient denies any modifying or aggravating factors.  Patient denies any recent UTI's, gross hematuria, dysuria or suprapubic/flank pain.  Patient denies any fevers, chills, nausea or vomiting.     IPSS     Row Name 01/11/23 0900         International Prostate Symptom Score   How often have you had the sensation of not emptying your bladder? About half the time     How often have you had to urinate less than every two hours? About half the time     How often have you found you stopped and started again several times when you urinated? More than half the time     How often have you found it difficult to postpone urination? More than half the time     How often have you had a weak urinary stream? About half the time     How often have you had to strain to start urination? Less than half the time     How many times did you typically get up at night to urinate? None     Total IPSS Score 19       Quality of Life due to urinary symptoms   If you were to spend the rest of your life with your urinary condition just the way it is now how would you feel about that? Mostly Satisfied                Score:   1-7 Mild 8-19 Moderate 20-35 Severe   PMH: Past Medical History:  Diagnosis Date   Anemia    BPH (benign prostatic hypertrophy)    COVID-19 04/2020   Diabetes mellitus (HCC)    Diabetic retinopathy associated with type 2 diabetes mellitus (HCC)    ED (erectile dysfunction)    GERD (gastroesophageal reflux disease)    Heart disease    History of kidney stones    HLD (hyperlipidemia)    HTN (hypertension)    Over weight    Sleep apnea    uses cpap    Surgical History: Past Surgical History:  Procedure Laterality Date   BACK SURGERY  01/22/2009   CATARACT EXTRACTION, BILATERAL  2019   COLONOSCOPY     2004, 2007, 2013, 2019   COLONOSCOPY N/A 09/18/2022   Procedure: COLONOSCOPY;  Surgeon: Regis Bill, MD;  Location: [facility];  Service: Endoscopy;  Laterality: N/A;   CYSTOSCOPY/URETEROSCOPY/HOLMIUM LASER/STENT PLACEMENT Left 07/01/2020   Procedure: CYSTOSCOPY/URETEROSCOPY/HOLMIUM LASER/STENT PLACEMENT;  Surgeon: Sondra Come, MD;  Location: [facility] ORS;  Service: Urology;  Laterality: Left;   ESOPHAGOGASTRODUODENOSCOPY  01/01/2003   LUMBAR LAMINECTOMY  06/22/2009   L4,5   MOHS SURGERY  2019   nose   PILONIDAL CYST EXCISION N/A 03/09/2022   Procedure: CYST EXCISION PILONIDAL EXTENSIVE;  Surgeon: Sung Amabile, DO;  Location: [facility] ORS;  Service: General;  Laterality: N/A;   POLYPECTOMY  09/18/2022   Procedure: POLYPECTOMY INTESTINAL;  Surgeon: Regis Bill, MD;  Location: [facility];  Service: Endoscopy;;    Home Medications:  Allergies as of 01/11/2023   No Known Allergies      Medication List        Accurate as of January 11, 2023  9:24 AM. If you have any questions, ask your nurse or doctor.          STOP taking these medications    HYDROcodone-acetaminophen 5-325 MG tablet Commonly known as: Norco Stopped by: Teona Vargus   lisinopril 20 MG tablet Commonly known as: ZESTRIL Stopped by: Michiel Cowboy       TAKE these  medications    aspirin EC 81 MG tablet Take 81 mg by mouth in the morning. Swallow whole.   Cholecalciferol 25 MCG (1000 UT) tablet Take 1,000 Units by mouth in the morning.   cyanocobalamin 500 MCG tablet Commonly known as: VITAMIN B12 Take 500 mcg by mouth in the morning.   ferrous sulfate 325 (65 FE) MG tablet Take 325 mg by mouth daily with breakfast.   glucose blood test strip Use 1 strip via meter twice daily as directed.   insulin NPH-regular Human (70-30) 100 UNIT/ML injection Inject 55 Units into the skin 2 (two) times daily with a meal.   INSULIN SYRINGE .5CC/31GX5/16" 31G X 5/16" 0.5 ML Misc as directed (twice daily insulin dosing.)   losartan 50 MG tablet Commonly known as: COZAAR Take 50 mg by mouth daily. Start when finished with lisinopril rx   Magnesium 250 MG Tabs Take 250 mg by mouth in the morning.   metFORMIN 1000 MG tablet Commonly known as: GLUCOPHAGE Take 1,000 mg by mouth 2 (two) times daily with a meal.   omeprazole 20 MG capsule Commonly known as: PRILOSEC Take 20 mg by mouth every other day. In the morning   pyridoxine 100 MG tablet Commonly known as: B-6 Take 100 mg by mouth in the morning.   simvastatin 10 MG tablet Commonly known as: ZOCOR Take 5 mg by mouth every evening.   tamsulosin 0.4 MG Caps capsule Commonly known as: FLOMAX Take 1 capsule (0.4 mg total) by mouth every evening.        Allergies: No Known Allergies  Family History: Family History  Problem Relation Age of Onset   Pancreatic cancer Maternal Grandmother    Heart disease Father    Kidney disease Neg Hx    Prostate cancer Neg Hx    Bladder Cancer Neg Hx     Social History:  reports that he has never smoked. He has never been exposed to tobacco smoke. He has never used smokeless tobacco. He reports current alcohol use. He reports that he does not use drugs.  ROS: For pertinent review of systems please refer to history of present illness  Physical  Exam: BP (!) 146/69   Pulse 61   Ht 5\' 8"  (1.727 m)   Wt 240 lb (108.9 kg)   BMI 36.49 kg/m   Constitutional:  Well nourished. Alert and oriented, No acute distress. HEENT: [place] AT, moist mucus membranes.  Trachea midline Cardiovascular: No clubbing, cyanosis, or edema. Respiratory: Normal respiratory effort, no increased work of breathing. Neurologic:  Grossly intact, no focal deficits, moving all 4 extremities. Psychiatric: Normal mood and affect.   Laboratory Data: Hemoglobin A1C Order: [id number] Component Ref Range & Units 4 mo ago  Hemoglobin A1C 4.2 - 5.6 % 6.6 High   Average Blood Glucose (Calc) mg/dL 409  Resulting Agency [facility] - [facility]  Narrative Performed by [facility] - LAB Normal Range:    4.2 - 5.6% Increased Risk:  5.7 - 6.4% Diabetes:        >= 6.5% Glycemic Control for adults with diabetes:  <7%    Specimen Collected: 08/29/22 08:48   Performed by: [facility] Last Resulted: 08/29/22 12:22  Received From: [facility]  Result Received: 09/11/22 14:42   CBC w/auto Differential (3 Part) Order: [id number] Component Ref Range & Units 10 mo ago  WBC (White Blood Cell Count) 4.1 - 10.2 10^3/uL 7.5  RBC (Red Blood Cell Count) 4.69 - 6.13 10^6/uL 4.90  Hemoglobin 14.1 - 18.1 gm/dL 95.6  Hematocrit 21.3 - 52.0 % 45.0  MCV (Mean Corpuscular Volume) 80.0 - 100.0 fl 91.8  MCH (Mean Corpuscular Hemoglobin) 27.0 - 31.2 pg 30.4  MCHC (Mean Corpuscular Hemoglobin Concentration) 32.0 - 36.0 gm/dL 08.6  Platelet Count 578 - 450 10^3/uL 213  RDW-CV (Red Cell Distribution Width) 11.6 - 14.8 % 13.5  MPV (Mean Platelet Volume) 9.4 - 12.4 fl 9.3 Low   Neutrophils 1.50 - 7.80 10^3/uL 4.90  Lymphocytes 1.00 - 3.60 10^3/uL 2.10  Mixed Count 0.10 - 0.90 10^3/uL 0.50  Neutrophil % 32.0 - 70.0 % 64.6  Lymphocyte % 10.0 - 50.0 % 28.1  Mixed % 3.0 - 14.4 % 7.3  Resulting Agency [facility] - [facility]    Specimen Collected: 02/16/22 09:06   Performed by: [facility] Last Resulted: 02/16/22 09:56  Received From: [facility]  Result Received: 02/19/22 08:44   Comprehensive Metabolic Panel (CMP) Order: [id number] Component Ref Range & Units 10 mo ago  Glucose 70 - 110 mg/dL 84  Sodium 413 - 244 mmol/L 140  Potassium 3.6 - 5.1 mmol/L 4.4  Chloride 97 - 109 mmol/L 104  Carbon Dioxide (CO2) 22.0 - 32.0 mmol/L 27.9  Urea Nitrogen (BUN) 7 - 25 mg/dL 18  Creatinine 0.7 - 1.3 mg/dL 0.9  Glomerular Filtration Rate (eGFR) >60 mL/min/1.73sq m 92  Comment: CKD-EPI (2021) does not include patient's race in the calculation of eGFR.  Monitoring changes of plasma creatinine and eGFR over time is useful for monitoring kidney function.  Interpretive Ranges for eGFR (CKD-EPI 2021):  eGFR:       >60 mL/min/1.73 sq. m - Normal eGFR:       30-59 mL/min/1.73 sq. m - Moderately Decreased eGFR:       15-29 mL/min/1.73 sq. m  - Severely Decreased eGFR:       < 15 mL/min/1.73 sq. m  - Kidney Failure   Note: These eGFR calculations do not apply in acute situations when eGFR is changing rapidly or patients on dialysis.  Calcium 8.7 - 10.3 mg/dL 9.2  AST 8 - 39 U/L 26  ALT 6 - 57 U/L 33  Alk Phos (alkaline Phosphatase) 34 - 104 U/L 41  Albumin 3.5 - 4.8 g/dL 4.2  Bilirubin, Total 0.3 - 1.2 mg/dL 0.6  Protein, Total 6.1 - 7.9 g/dL 6.4  A/G Ratio 1.0 - 5.0 gm/dL 1.9  Resulting Agency [facility]   Specimen Collected: 02/16/22 09:06  Performed by: [facility] Last Resulted: 02/16/22 14:19  Received From: [facility]  Result Received: 02/19/22 08:44   Hemoglobin A1C Order: [id number] Component Ref Range & Units 10 mo ago  Hemoglobin A1C 4.2 - 5.6 % 6.5 High   Average Blood Glucose (Calc) mg/dL 782  Resulting Agency [facility]  Narrative Performed by [facility] Normal  Range:    4.2 - 5.6% Increased Risk:  5.7 - 6.4% Diabetes:        >= 6.5% Glycemic Control for adults with diabetes:  <7%    Specimen Collected: 02/16/22 09:06   Performed by: [facility] Last Resulted: 02/16/22 13:32  Received From: [facility]  Result Received: 02/19/22 08:44   Lipid Panel w/calc LDL Order: [id number] Component Ref Range & Units 10 mo ago  Cholesterol, Total 100 - 200 mg/dL 578  Triglyceride 35 - 199 mg/dL 79  HDL (High Density Lipoprotein) Cholesterol 29.0 - 71.0 mg/dL 46.9  LDL Calculated 0 - 130 mg/dL 67  VLDL Cholesterol mg/dL 16  Cholesterol/HDL Ratio 2.8  Resulting Agency [facility]   Specimen Collected: 02/16/22 09:06   Performed by: [facility] Last Resulted: 02/16/22 14:19  Received From: [facility]  Result Received: 02/19/22 08:44   Thyroid Stimulating-Hormone (TSH) Order: [id number] Component Ref Range & Units 10 mo ago  Thyroid Stimulating Hormone (TSH) 0.450-5.330 uIU/ml uIU/mL 2.386  Resulting Agency [facility] - [facility]   Specimen Collected: 02/16/22 09:06   Performed by: [facility] Last Resulted: 02/16/22 13:08  [facility]  Result Received: 02/19/22 08:44  I have reviewed the labs.    Pertinent Imaging N/A  Assessment & Plan:    1.  BPH with LUTS -PSA pending -continue conservative management, avoiding bladder irritants and timed voiding's -patient's symptoms are not worrisome as are stable and are consistent with BPH and an ageing bladder -he does not desire any medical treatment or outlet procedure at this time    Return for Follow-up in 1 year with PSA, IPSS, PSA at the time of appointment.  Cloretta Ned [facility] [address] [place], [place] [address] [phone]

## 2023-01-11 ENCOUNTER — Ambulatory Visit: Payer: PPO | Admitting: Urology

## 2023-01-11 ENCOUNTER — Other Ambulatory Visit: Payer: PRIVATE HEALTH INSURANCE

## 2023-01-11 ENCOUNTER — Encounter: Payer: Self-pay | Admitting: Urology

## 2023-01-11 VITALS — BP 146/69 | HR 61 | Ht 68.0 in | Wt 240.0 lb

## 2023-01-11 DIAGNOSIS — N401 Enlarged prostate with lower urinary tract symptoms: Secondary | ICD-10-CM | POA: Diagnosis not present

## 2023-01-11 DIAGNOSIS — N138 Other obstructive and reflux uropathy: Secondary | ICD-10-CM | POA: Diagnosis not present

## 2023-01-11 MED ORDER — TAMSULOSIN HCL 0.4 MG PO CAPS: 0.4000 mg | ORAL_CAPSULE | Freq: Every evening | ORAL | 3 refills | Status: DC

## 2023-01-12 LAB — PSA: Prostate Specific Ag, Serum: 0.2 ng/mL (ref 0.0–4.0)

## 2023-04-04 DIAGNOSIS — K219 Gastro-esophageal reflux disease without esophagitis: Secondary | ICD-10-CM | POA: Diagnosis not present

## 2023-04-04 DIAGNOSIS — E782 Mixed hyperlipidemia: Secondary | ICD-10-CM | POA: Diagnosis not present

## 2023-04-04 DIAGNOSIS — Z0000 Encounter for general adult medical examination without abnormal findings: Secondary | ICD-10-CM | POA: Diagnosis not present

## 2023-04-04 DIAGNOSIS — Z978 Presence of other specified devices: Secondary | ICD-10-CM | POA: Diagnosis not present

## 2023-04-04 DIAGNOSIS — Z794 Long term (current) use of insulin: Secondary | ICD-10-CM | POA: Diagnosis not present

## 2023-04-04 DIAGNOSIS — G4733 Obstructive sleep apnea (adult) (pediatric): Secondary | ICD-10-CM | POA: Diagnosis not present

## 2023-04-04 DIAGNOSIS — Z79899 Other long term (current) drug therapy: Secondary | ICD-10-CM | POA: Diagnosis not present

## 2023-04-04 DIAGNOSIS — E113293 Type 2 diabetes mellitus with mild nonproliferative diabetic retinopathy without macular edema, bilateral: Secondary | ICD-10-CM | POA: Diagnosis not present

## 2023-04-04 DIAGNOSIS — I10 Essential (primary) hypertension: Secondary | ICD-10-CM | POA: Diagnosis not present

## 2023-08-26 DIAGNOSIS — L570 Actinic keratosis: Secondary | ICD-10-CM | POA: Diagnosis not present

## 2023-08-26 DIAGNOSIS — L820 Inflamed seborrheic keratosis: Secondary | ICD-10-CM | POA: Diagnosis not present

## 2023-08-26 DIAGNOSIS — D2261 Melanocytic nevi of right upper limb, including shoulder: Secondary | ICD-10-CM | POA: Diagnosis not present

## 2023-08-26 DIAGNOSIS — L821 Other seborrheic keratosis: Secondary | ICD-10-CM | POA: Diagnosis not present

## 2023-08-26 DIAGNOSIS — D2271 Melanocytic nevi of right lower limb, including hip: Secondary | ICD-10-CM | POA: Diagnosis not present

## 2023-08-26 DIAGNOSIS — D2272 Melanocytic nevi of left lower limb, including hip: Secondary | ICD-10-CM | POA: Diagnosis not present

## 2023-08-26 DIAGNOSIS — R208 Other disturbances of skin sensation: Secondary | ICD-10-CM | POA: Diagnosis not present

## 2023-08-26 DIAGNOSIS — D225 Melanocytic nevi of trunk: Secondary | ICD-10-CM | POA: Diagnosis not present

## 2023-08-26 DIAGNOSIS — D2262 Melanocytic nevi of left upper limb, including shoulder: Secondary | ICD-10-CM | POA: Diagnosis not present

## 2023-09-18 DIAGNOSIS — R051 Acute cough: Secondary | ICD-10-CM | POA: Diagnosis not present

## 2023-09-18 DIAGNOSIS — I10 Essential (primary) hypertension: Secondary | ICD-10-CM | POA: Diagnosis not present

## 2023-10-09 DIAGNOSIS — E113293 Type 2 diabetes mellitus with mild nonproliferative diabetic retinopathy without macular edema, bilateral: Secondary | ICD-10-CM | POA: Diagnosis not present

## 2023-10-09 DIAGNOSIS — Z79899 Other long term (current) drug therapy: Secondary | ICD-10-CM | POA: Diagnosis not present

## 2023-10-09 DIAGNOSIS — Z978 Presence of other specified devices: Secondary | ICD-10-CM | POA: Diagnosis not present

## 2023-10-09 DIAGNOSIS — I10 Essential (primary) hypertension: Secondary | ICD-10-CM | POA: Diagnosis not present

## 2023-10-09 DIAGNOSIS — Z23 Encounter for immunization: Secondary | ICD-10-CM | POA: Diagnosis not present

## 2023-10-09 DIAGNOSIS — Z1331 Encounter for screening for depression: Secondary | ICD-10-CM | POA: Diagnosis not present

## 2023-10-09 DIAGNOSIS — E782 Mixed hyperlipidemia: Secondary | ICD-10-CM | POA: Diagnosis not present

## 2023-10-09 DIAGNOSIS — E11649 Type 2 diabetes mellitus with hypoglycemia without coma: Secondary | ICD-10-CM | POA: Diagnosis not present

## 2023-10-09 DIAGNOSIS — G4733 Obstructive sleep apnea (adult) (pediatric): Secondary | ICD-10-CM | POA: Diagnosis not present

## 2023-10-09 DIAGNOSIS — K219 Gastro-esophageal reflux disease without esophagitis: Secondary | ICD-10-CM | POA: Diagnosis not present

## 2023-10-09 DIAGNOSIS — Z794 Long term (current) use of insulin: Secondary | ICD-10-CM | POA: Diagnosis not present

## 2023-10-21 DIAGNOSIS — L988 Other specified disorders of the skin and subcutaneous tissue: Secondary | ICD-10-CM | POA: Diagnosis not present

## 2023-12-03 ENCOUNTER — Ambulatory Visit: Payer: Self-pay | Admitting: Surgery

## 2023-12-03 ENCOUNTER — Encounter
Admission: RE | Admit: 2023-12-03 | Discharge: 2023-12-03 | Disposition: A | Discharge status: Home / Self Care | Source: Ambulatory Visit | Attending: Surgery | Admitting: Surgery

## 2023-12-03 ENCOUNTER — Other Ambulatory Visit: Payer: Self-pay

## 2023-12-03 DIAGNOSIS — E119 Type 2 diabetes mellitus without complications: Secondary | ICD-10-CM

## 2023-12-03 DIAGNOSIS — Z01812 Encounter for preprocedural laboratory examination: Secondary | ICD-10-CM

## 2023-12-03 DIAGNOSIS — Z01810 Encounter for preprocedural cardiovascular examination: Secondary | ICD-10-CM

## 2023-12-03 DIAGNOSIS — I10 Essential (primary) hypertension: Secondary | ICD-10-CM

## 2023-12-03 HISTORY — DX: Other specified disorders of the skin and subcutaneous tissue: L98.8

## 2023-12-03 NOTE — H&P (View-Only) (Signed)
 Subjective:    CC: Pilonidal disease [L98.8]   HPI:  Return for evaluation of above.    History of Present Illness Shawn Stevens. is a 73 year old male with a history of pilonidal abscess who presents with a perirectal abscess and a ruptured pilonidal cyst.   He experiences recurring pilonidal abscesses and cysts every few months. The cysts swell with pus, drain spontaneously, and recur in a cycle. Started again shortly after his initial pilonidal cyst removal surgery in 2024. He drains pus and blood, causing pain when swollen and discomfort otherwise.   He works as a electrical engineer, involving prolonged sitting. He has no fever or chills.   He has diabetes and takes aspirin daily. He does not smoke.     Past Medical History:  has a past medical history of BPH with obstruction/lower urinary tract symptoms, Carpal tunnel syndrome, COVID-19 (04/2020), Diabetes mellitus, type 2 (CMS/HHS-HCC) (1993), Diabetic retinopathy associated with type 2 diabetes mellitus (CMS/HHS-HCC), Erectile dysfunction, GERD (gastroesophageal reflux disease), Hyperlipidemia, Hypertension, Low back pain, Nephrolithiasis, Obesity (BMI 30-39.9), unspecified, and Sleep apnea.   Past Surgical History:  has a past surgical history that includes Destruction Progressive Retinopathy By Laser; Laminectomy Lumbar Spine (06/22/2009); egd (01/01/2003); Colonoscopy (10/12/2005, 01/01/2003); Colonoscopy (02/22/2011); Colonoscopy (02/15/2017); Mohs surgery (01/24/2017); Cataract extraction (Bilateral, 2019); Cystoscopy w/ ureteroscopy (Left, 07/01/2020); cyst excision pilonidal extensive (03/09/2022); and Colon @ [facility] (09/18/2022).   Family History: family history includes Diabetes type II in his mother; High blood pressure (Hypertension) in his father; Myocardial Infarction (Heart attack) in his father; Neuropathy in his mother.   Social History:  reports that he has never smoked. He has been exposed to tobacco smoke. He has never used  smokeless tobacco. He reports that he does not currently use alcohol. He reports that he does not use drugs.   Current Medications: has a current medication list which includes the following prescription(s): aspirin, freestyle libre 3 plus sensor, cholecalciferol, cyanocobalamin  (vitamin b-12), ferrous sulfate, freestyle libre 3 reader, novolin 70/30 u-100 insulin, bd insulin syringe ultra-fine, losartan, magnesium, metformin, omeprazole, pyridoxine (vitamin b6), simvastatin, tamsulosin , blood glucose diagnostic, and blood glucose meter.   Allergies:  No Known Allergies   ROS:  A 15 point review of systems was performed and pertinent positives and negatives noted in HPI   Objective:      BP 118/66   Pulse 62   Ht 172.7 cm (5' 8)   Wt (!) 108.9 kg (240 lb)   BMI 36.49 kg/m    Constitutional :  No distress, cooperative, alert  Lymphatics/Throat:  Supple with no lymphadenopathy  Respiratory:  Clear to auscultation bilaterally  Cardiovascular:  Regular rate and rhythm  Gastrointestinal: Soft, non-tender, non-distended, no organomegaly.  Musculoskeletal: Steady gait and movement  Skin: Cool and moist, recurrent pilonidal disease. Midline pit present. No drainage today.  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 Will proceed with bascom flap this time in attempt to prevent recurrence   Plan:      1. 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  Alternatives include continued observation.  Benefits include possible symptom relief, pathologic evaluation, improved cosmesis. Discussed the risk of surgery including recurrence, chronic pain, post-op infxn, poor cosmesis, poor/delayed wound healing, and possible re-operation to address said risks. The risks of general anesthetic, if used, includes MI, CVA, sudden  death or even reaction to anesthetic medications also  discussed.  Typical post-op recovery time of 3-5 days with possible activity restrictions were also discussed.   The patient verbalized understanding and all questions were answered to the patient's satisfaction.   2. Patient has elected to proceed with surgical treatment. Procedure will be scheduled.   labs/images/medications/previous chart entries reviewed personally and relevant changes/updates noted above.

## 2023-12-03 NOTE — H&P (Signed)
 Subjective:    CC: Pilonidal disease [L98.8]   HPI:  Return for evaluation of above.    History of Present Illness Shawn Stevens. is a 73 year old male with a history of pilonidal abscess who presents with a perirectal abscess and a ruptured pilonidal cyst.   He experiences recurring pilonidal abscesses and cysts every few months. The cysts swell with pus, drain spontaneously, and recur in a cycle. Started again shortly after his initial pilonidal cyst removal surgery in 2024. He drains pus and blood, causing pain when swollen and discomfort otherwise.   He works as a electrical engineer, involving prolonged sitting. He has no fever or chills.   He has diabetes and takes aspirin daily. He does not smoke.     Past Medical History:  has a past medical history of BPH with obstruction/lower urinary tract symptoms, Carpal tunnel syndrome, COVID-19 (04/2020), Diabetes mellitus, type 2 (CMS/HHS-HCC) (1993), Diabetic retinopathy associated with type 2 diabetes mellitus (CMS/HHS-HCC), Erectile dysfunction, GERD (gastroesophageal reflux disease), Hyperlipidemia, Hypertension, Low back pain, Nephrolithiasis, Obesity (BMI 30-39.9), unspecified, and Sleep apnea.   Past Surgical History:  has a past surgical history that includes Destruction Progressive Retinopathy By Laser; Laminectomy Lumbar Spine (06/22/2009); egd (01/01/2003); Colonoscopy (10/12/2005, 01/01/2003); Colonoscopy (02/22/2011); Colonoscopy (02/15/2017); Mohs surgery (01/24/2017); Cataract extraction (Bilateral, 2019); Cystoscopy w/ ureteroscopy (Left, 07/01/2020); cyst excision pilonidal extensive (03/09/2022); and Colon @ [facility] (09/18/2022).   Family History: family history includes Diabetes type II in his mother; High blood pressure (Hypertension) in his father; Myocardial Infarction (Heart attack) in his father; Neuropathy in his mother.   Social History:  reports that he has never smoked. He has been exposed to tobacco smoke. He has never used  smokeless tobacco. He reports that he does not currently use alcohol. He reports that he does not use drugs.   Current Medications: has a current medication list which includes the following prescription(s): aspirin, freestyle libre 3 plus sensor, cholecalciferol, cyanocobalamin  (vitamin b-12), ferrous sulfate, freestyle libre 3 reader, novolin 70/30 u-100 insulin, bd insulin syringe ultra-fine, losartan, magnesium, metformin, omeprazole, pyridoxine (vitamin b6), simvastatin, tamsulosin , blood glucose diagnostic, and blood glucose meter.   Allergies:  No Known Allergies   ROS:  A 15 point review of systems was performed and pertinent positives and negatives noted in HPI   Objective:      BP 118/66   Pulse 62   Ht 172.7 cm (5' 8)   Wt (!) 108.9 kg (240 lb)   BMI 36.49 kg/m    Constitutional :  No distress, cooperative, alert  Lymphatics/Throat:  Supple with no lymphadenopathy  Respiratory:  Clear to auscultation bilaterally  Cardiovascular:  Regular rate and rhythm  Gastrointestinal: Soft, non-tender, non-distended, no organomegaly.  Musculoskeletal: Steady gait and movement  Skin: Cool and moist, recurrent pilonidal disease. Midline pit present. No drainage today.  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 Will proceed with bascom flap this time in attempt to prevent recurrence   Plan:      1. 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  Alternatives include continued observation.  Benefits include possible symptom relief, pathologic evaluation, improved cosmesis. Discussed the risk of surgery including recurrence, chronic pain, post-op infxn, poor cosmesis, poor/delayed wound healing, and possible re-operation to address said risks. The risks of general anesthetic, if used, includes MI, CVA, sudden  death or even reaction to anesthetic medications also  discussed.  Typical post-op recovery time of 3-5 days with possible activity restrictions were also discussed.   The patient verbalized understanding and all questions were answered to the patient's satisfaction.   2. Patient has elected to proceed with surgical treatment. Procedure will be scheduled.   labs/images/medications/previous chart entries reviewed personally and relevant changes/updates noted above.

## 2023-12-03 NOTE — Patient Instructions (Addendum)
 Your procedure is scheduled on: 12/12/23 - Thursday Report to the Registration Desk on the 1st floor of the [facility]. To find out your arrival time, please call [phone] between 1PM - 3PM on: 12/11/23 - Wednesday If your arrival time is 6:00 am, do not arrive before that time as the [facility] entrance doors do not open until 6:00 am.  REMEMBER: Instructions that are not followed completely may result in serious medical risk, up to and including death; or upon the discretion of your surgeon and anesthesiologist your surgery may need to be rescheduled.  Do not eat food after midnight the night before surgery.  No gum chewing or hard candies.  You may drink WATER up to 2 hours before you are scheduled to arrive for your surgery. Do not drink anything within 2 hours of your scheduled arrival time.  One week prior to surgery: Stop Anti-inflammatories (NSAIDS) such as Advil , Aleve, Ibuprofen , Motrin , Naproxen, Naprosyn and Aspirin based products such as Excedrin, Goody's Powder, BC Powder. You may take Tylenol  if needed for pain up until the day of surgery.  Stop ANY OVER THE COUNTER supplements until after surgery : Cholecalciferol ,Magnesium .  insulin NPH - hold on the morning of surgery  metFORMIN (GLUCOPHAGE) hold beginning 11/18.  ASPIRIN hold 7 days prior to your surgery.  HOLD Losartan on the day of surgery.  ON THE DAY OF SURGERY ONLY TAKE THESE MEDICATIONS WITH SIPS OF WATER:  omeprazole   No Alcohol for 24 hours before or after surgery.  No Smoking including e-cigarettes for 24 hours before surgery.  No chewable tobacco products for at least 6 hours before surgery.  No nicotine patches on the day of surgery.  Do not use any recreational drugs for at least a week (preferably 2 weeks) before your surgery.  Please be advised that the combination of cocaine and anesthesia may have negative outcomes, up to and including death. If you test positive for cocaine,  your surgery will be cancelled.  On the morning of surgery brush your teeth with toothpaste and water, you may rinse your mouth with mouthwash if you wish. Do not swallow any toothpaste or mouthwash.  Use CHG Soap or wipes as directed on instruction sheet.  Do not wear jewelry, make-up, hairpins, clips or nail polish.  For welded (permanent) jewelry: bracelets, anklets, waist bands, etc.  Please have this removed prior to surgery.  If it is not removed, there is a chance that hospital personnel will need to cut it off on the day of surgery.  Do not wear lotions, powders, or perfumes.   Do not shave body hair from the neck down 48 hours before surgery.  Contact lenses, hearing aids and dentures may not be worn into surgery.  Do not bring valuables to the hospital. [facility] is not responsible for any missing/lost belongings or valuables.   Bring your C-PAP to the hospital in case you may have to spend the night.   Notify your doctor if there is any change in your medical condition (cold, fever, infection).  Wear comfortable clothing (specific to your surgery type) to the hospital.  After surgery, you can help prevent lung complications by doing breathing exercises.  Take deep breaths and cough every 1-2 hours. Your doctor may order a device called an Incentive Spirometer to help you take deep breaths.  If you are being admitted to the hospital overnight, leave your suitcase in the car. After surgery it may be brought to  your room.  In case of increased patient census, it may be necessary for you, the patient, to continue your postoperative care in the Same Day Surgery department.  If you are being discharged the day of surgery, you will not be allowed to drive home. You will need a responsible individual to drive you home and stay with you for 24 hours after surgery.   If you are taking public transportation, you will need to have a responsible individual with you.  Please call  the Pre-admissions Testing Dept. at [phone] if you have any questions about these instructions.  Surgery Visitation Policy:  Patients having surgery or a procedure may have two visitors.  Children under the age of 23 must have an adult with them who is not the patient.  Inpatient Visitation:    Visiting hours are 7 a.m. to 8 p.m. Up to four visitors are allowed at one time in a patient room. The visitors may rotate out with other people during the day.  One visitor age 43 or older may stay with the patient overnight and must be in the room by 8 p.m.   Merchandiser, Retail to address health-related social needs:  [email or web address]                                                                                                             Preparing for Surgery with CHLORHEXIDINE  GLUCONATE (CHG) Soap  Chlorhexidine  Gluconate (CHG) Soap  o An antiseptic cleaner that kills germs and bonds with the skin to continue killing germs even after washing  o Used for showering the night before surgery and morning of surgery  Before surgery, you can play an important role by reducing the number of germs on your skin.  CHG (Chlorhexidine  gluconate) soap is an antiseptic cleanser which kills germs and bonds with the skin to continue killing germs even after washing.  Please do not use if you have an allergy to CHG or antibacterial soaps. If your skin becomes reddened/irritated stop using the CHG.  1. Shower the NIGHT BEFORE SURGERY with CHG soap.  2. If you choose to wash your hair, wash your hair first as usual with your normal shampoo.  3. After shampooing, rinse your hair and body thoroughly to remove the shampoo.  4. Use CHG as you would any other liquid soap. You can apply CHG directly to the skin and wash gently with a clean washcloth.  5. Apply the CHG soap to your body only from the neck down. Do not use on open wounds or open sores. Avoid contact with your  eyes, ears, mouth, and genitals (private parts). Wash face and genitals (private parts) with your normal soap.  6. Wash thoroughly, paying special attention to the area where your surgery will be performed.  7. Thoroughly rinse your body with warm water.  8. Do not shower/wash with your normal soap after using and rinsing off the CHG soap.  9. Do not use lotions, oils, etc., after showering with CHG.  10. Pat yourself dry  with a clean towel.  11. Wear clean pajamas to bed the night before surgery.  12. Place clean sheets on your bed the night of your shower and do not sleep with pets.  13. Do not apply any deodorants/lotions/powders.  14. Please wear clean clothes to the hospital.  15. Remember to brush your teeth with your regular toothpaste.

## 2023-12-06 ENCOUNTER — Encounter
Admission: RE | Admit: 2023-12-06 | Discharge: 2023-12-06 | Disposition: A | Discharge status: Home / Self Care | Source: Ambulatory Visit | Attending: Surgery | Admitting: Surgery

## 2023-12-06 DIAGNOSIS — E119 Type 2 diabetes mellitus without complications: Secondary | ICD-10-CM | POA: Insufficient documentation

## 2023-12-06 DIAGNOSIS — Z794 Long term (current) use of insulin: Secondary | ICD-10-CM | POA: Diagnosis not present

## 2023-12-06 DIAGNOSIS — Z01818 Encounter for other preprocedural examination: Secondary | ICD-10-CM | POA: Insufficient documentation

## 2023-12-06 DIAGNOSIS — I10 Essential (primary) hypertension: Secondary | ICD-10-CM | POA: Insufficient documentation

## 2023-12-06 DIAGNOSIS — Z01810 Encounter for preprocedural cardiovascular examination: Secondary | ICD-10-CM

## 2023-12-06 LAB — BASIC METABOLIC PANEL WITH GFR
Anion gap: 11 (ref 5–15)
BUN: 13 mg/dL (ref 8–23)
CO2: 25 mmol/L (ref 22–32)
Calcium: 9.5 mg/dL (ref 8.9–10.3)
Chloride: 105 mmol/L (ref 98–111)
Creatinine, Ser: 0.82 mg/dL (ref 0.61–1.24)
GFR, Estimated: >60 mL/min (ref >60)
Glucose, Bld: 122 mg/dL — ABNORMAL HIGH (ref 70–99)
Potassium: 4 mmol/L (ref 3.5–5.1)
Sodium: 141 mmol/L (ref 135–145)

## 2023-12-06 LAB — CBC
HCT: 44 % (ref 39.0–52.0)
Hemoglobin: 14.7 g/dL (ref 13.0–17.0)
MCH: 29.9 pg (ref 26.0–34.0)
MCHC: 33.4 g/dL (ref 30.0–36.0)
MCV: 89.6 fL (ref 80.0–100.0)
Platelets: 229 K/uL (ref 150–400)
RBC: 4.91 MIL/uL (ref 4.22–5.81)
RDW: 13 % (ref 11.5–15.5)
WBC: 8.8 K/uL (ref 4.0–10.5)
nRBC: 0 % (ref 0.0–0.2)

## 2023-12-11 MED ORDER — ORAL CARE MOUTH RINSE: 15.0000 mL | Freq: Once | OROMUCOSAL | Status: AC

## 2023-12-11 MED ORDER — CEFAZOLIN SODIUM-DEXTROSE 2-4 GM/100ML-% IV SOLN
2.0000 g | INTRAVENOUS | Status: AC
  Administered 2023-12-12: 2 g via INTRAVENOUS

## 2023-12-11 MED ORDER — ACETAMINOPHEN 500 MG PO TABS
1000.0000 mg | ORAL_TABLET | ORAL | Status: AC
  Administered 2023-12-12: 1000 mg via ORAL

## 2023-12-11 MED ORDER — SODIUM CHLORIDE 0.9 % IV SOLN: INTRAVENOUS | Status: DC

## 2023-12-11 MED ORDER — CHLORHEXIDINE GLUCONATE CLOTH 2 % EX PADS
6.0000 | MEDICATED_PAD | Freq: Once | CUTANEOUS | Status: AC
  Administered 2023-12-12: 6 via TOPICAL

## 2023-12-11 MED ORDER — CHLORHEXIDINE GLUCONATE 0.12 % MT SOLN
15.0000 mL | Freq: Once | OROMUCOSAL | Status: AC
  Administered 2023-12-12: 15 mL via OROMUCOSAL

## 2023-12-11 MED ORDER — GABAPENTIN 300 MG PO CAPS
300.0000 mg | ORAL_CAPSULE | ORAL | Status: AC
  Administered 2023-12-12: 300 mg via ORAL

## 2023-12-11 MED ORDER — CELECOXIB 200 MG PO CAPS
200.0000 mg | ORAL_CAPSULE | ORAL | Status: AC
  Administered 2023-12-12: 200 mg via ORAL

## 2023-12-12 ENCOUNTER — Other Ambulatory Visit: Payer: Self-pay

## 2023-12-12 ENCOUNTER — Ambulatory Visit: Admitting: Anesthesiology

## 2023-12-12 ENCOUNTER — Ambulatory Visit: Payer: Self-pay | Admitting: Urgent Care

## 2023-12-12 ENCOUNTER — Encounter: Payer: Self-pay | Admitting: Surgery

## 2023-12-12 ENCOUNTER — Ambulatory Visit
Admission: RE | Admit: 2023-12-12 | Discharge: 2023-12-12 | Disposition: A | Discharge status: Home / Self Care | Source: Ambulatory Visit | Attending: Surgery | Admitting: Surgery

## 2023-12-12 ENCOUNTER — Encounter
Admission: RE | Disposition: A | Discharge status: Home / Self Care | Payer: Self-pay | Source: Ambulatory Visit | Attending: Surgery

## 2023-12-12 DIAGNOSIS — L988 Other specified disorders of the skin and subcutaneous tissue: Secondary | ICD-10-CM | POA: Diagnosis not present

## 2023-12-12 DIAGNOSIS — Z794 Long term (current) use of insulin: Secondary | ICD-10-CM | POA: Diagnosis not present

## 2023-12-12 DIAGNOSIS — Z7722 Contact with and (suspected) exposure to environmental tobacco smoke (acute) (chronic): Secondary | ICD-10-CM | POA: Insufficient documentation

## 2023-12-12 DIAGNOSIS — E119 Type 2 diabetes mellitus without complications: Secondary | ICD-10-CM | POA: Insufficient documentation

## 2023-12-12 DIAGNOSIS — I10 Essential (primary) hypertension: Secondary | ICD-10-CM | POA: Insufficient documentation

## 2023-12-12 DIAGNOSIS — Z7984 Long term (current) use of oral hypoglycemic drugs: Secondary | ICD-10-CM | POA: Diagnosis not present

## 2023-12-12 DIAGNOSIS — I498 Other specified cardiac arrhythmias: Secondary | ICD-10-CM | POA: Diagnosis not present

## 2023-12-12 DIAGNOSIS — I4510 Unspecified right bundle-branch block: Secondary | ICD-10-CM | POA: Diagnosis not present

## 2023-12-12 DIAGNOSIS — L0501 Pilonidal cyst with abscess: Secondary | ICD-10-CM | POA: Insufficient documentation

## 2023-12-12 DIAGNOSIS — G4730 Sleep apnea, unspecified: Secondary | ICD-10-CM | POA: Diagnosis not present

## 2023-12-12 DIAGNOSIS — Z7982 Long term (current) use of aspirin: Secondary | ICD-10-CM | POA: Insufficient documentation

## 2023-12-12 HISTORY — PX: PROCEDURE, ADVANCEMENT FLAP, BACK: SHX7623

## 2023-12-12 HISTORY — PX: PILONIDAL CYST EXCISION: SHX744

## 2023-12-12 LAB — GLUCOSE, CAPILLARY
Glucose-Capillary: 188 mg/dL — ABNORMAL HIGH (ref 70–99)
Glucose-Capillary: 206 mg/dL — ABNORMAL HIGH (ref 70–99)

## 2023-12-12 SURGERY — EXCISION, SIMPLE PILONIDAL CYST
Anesthesia: General

## 2023-12-12 MED ORDER — FENTANYL CITRATE (PF) 100 MCG/2ML IJ SOLN
INTRAMUSCULAR | Status: AC
  Filled 2023-12-12: qty 2

## 2023-12-12 MED ORDER — FENTANYL CITRATE (PF) 100 MCG/2ML IJ SOLN: 25.0000 ug | INTRAMUSCULAR | Status: DC | PRN

## 2023-12-12 MED ORDER — SUGAMMADEX SODIUM 200 MG/2ML IV SOLN
INTRAVENOUS | Status: DC | PRN
  Administered 2023-12-12: 200 mg via INTRAVENOUS

## 2023-12-12 MED ORDER — ACETAMINOPHEN 325 MG PO TABS
650.0000 mg | ORAL_TABLET | Freq: Three times a day (TID) | ORAL | 0 refills | Status: AC | PRN
  Filled 2023-12-12: qty 40, 7d supply, fill #0

## 2023-12-12 MED ORDER — OXYCODONE HCL 5 MG PO TABS: 5.0000 mg | ORAL_TABLET | Freq: Once | ORAL | Status: DC | PRN

## 2023-12-12 MED ORDER — ROCURONIUM BROMIDE 10 MG/ML (PF) SYRINGE
PREFILLED_SYRINGE | INTRAVENOUS | Status: AC
  Filled 2023-12-12: qty 10

## 2023-12-12 MED ORDER — BUPIVACAINE-EPINEPHRINE 0.5% -1:200000 IJ SOLN
INTRAMUSCULAR | Status: DC | PRN
  Administered 2023-12-12: 30 mL

## 2023-12-12 MED ORDER — ONDANSETRON HCL 4 MG/2ML IJ SOLN
INTRAMUSCULAR | Status: DC | PRN
  Administered 2023-12-12: 4 mg via INTRAVENOUS

## 2023-12-12 MED ORDER — DOCUSATE SODIUM 100 MG PO CAPS
100.0000 mg | ORAL_CAPSULE | Freq: Two times a day (BID) | ORAL | 0 refills | Status: AC | PRN
  Filled 2023-12-12: qty 20, 10d supply, fill #0

## 2023-12-12 MED ORDER — GABAPENTIN 300 MG PO CAPS
ORAL_CAPSULE | ORAL | Status: AC
  Filled 2023-12-12: qty 1

## 2023-12-12 MED ORDER — ACETAMINOPHEN 500 MG PO TABS
ORAL_TABLET | ORAL | Status: AC
  Filled 2023-12-12: qty 2

## 2023-12-12 MED ORDER — BUPIVACAINE-EPINEPHRINE (PF) 0.5% -1:200000 IJ SOLN
INTRAMUSCULAR | Status: AC
  Filled 2023-12-12: qty 30

## 2023-12-12 MED ORDER — CHLORHEXIDINE GLUCONATE 0.12 % MT SOLN
OROMUCOSAL | Status: AC
  Filled 2023-12-12: qty 15

## 2023-12-12 MED ORDER — CEFAZOLIN SODIUM-DEXTROSE 2-4 GM/100ML-% IV SOLN
INTRAVENOUS | Status: AC
  Filled 2023-12-12: qty 100

## 2023-12-12 MED ORDER — PROPOFOL 10 MG/ML IV BOLUS
INTRAVENOUS | Status: DC | PRN
Start: 2023-12-12 — End: 2023-12-12
  Administered 2023-12-12: 200 mg via INTRAVENOUS

## 2023-12-12 MED ORDER — OXYCODONE HCL 5 MG/5ML PO SOLN: 5.0000 mg | Freq: Once | ORAL | Status: DC | PRN

## 2023-12-12 MED ORDER — ACETAMINOPHEN 10 MG/ML IV SOLN: 1000.0000 mg | Freq: Once | INTRAVENOUS | Status: DC | PRN

## 2023-12-12 MED ORDER — ROCURONIUM BROMIDE 100 MG/10ML IV SOLN
INTRAVENOUS | Status: DC | PRN
  Administered 2023-12-12: 60 mg via INTRAVENOUS

## 2023-12-12 MED ORDER — CELECOXIB 200 MG PO CAPS
ORAL_CAPSULE | ORAL | Status: AC
  Filled 2023-12-12: qty 1

## 2023-12-12 MED ORDER — DEXMEDETOMIDINE HCL IN NACL 80 MCG/20ML IV SOLN
INTRAVENOUS | Status: DC | PRN
  Administered 2023-12-12: 8 ug via INTRAVENOUS
  Administered 2023-12-12: 4 ug via INTRAVENOUS

## 2023-12-12 MED ORDER — LIDOCAINE HCL (CARDIAC) PF 100 MG/5ML IV SOSY
PREFILLED_SYRINGE | INTRAVENOUS | Status: DC | PRN
  Administered 2023-12-12: 100 mg via INTRAVENOUS

## 2023-12-12 MED ORDER — 0.9 % SODIUM CHLORIDE (POUR BTL) OPTIME
TOPICAL | Status: DC | PRN
  Administered 2023-12-12: 500 mL

## 2023-12-12 MED ORDER — FENTANYL CITRATE (PF) 100 MCG/2ML IJ SOLN
INTRAMUSCULAR | Status: DC | PRN
  Administered 2023-12-12 (×2): 50 ug via INTRAVENOUS

## 2023-12-12 MED ORDER — DROPERIDOL 2.5 MG/ML IJ SOLN: 0.6250 mg | Freq: Once | INTRAMUSCULAR | Status: DC | PRN

## 2023-12-12 MED ORDER — LIDOCAINE HCL (PF) 2 % IJ SOLN
INTRAMUSCULAR | Status: AC
  Filled 2023-12-12: qty 5

## 2023-12-12 MED ORDER — OXYCODONE-ACETAMINOPHEN 5-325 MG PO TABS
1.0000 | ORAL_TABLET | Freq: Three times a day (TID) | ORAL | 0 refills | Status: AC | PRN
  Filled 2023-12-12: qty 6, 2d supply, fill #0

## 2023-12-12 MED ORDER — ONDANSETRON HCL 4 MG/2ML IJ SOLN
INTRAMUSCULAR | Status: AC
  Filled 2023-12-12: qty 2

## 2023-12-12 SURGICAL SUPPLY — 34 items
ADHESIVE MASTISOL STRL (MISCELLANEOUS) ×1 IMPLANT
BLADE CLIPPER SURG (BLADE) ×1 IMPLANT
BLADE SURG SZ10 CARB STEEL (BLADE) ×1 IMPLANT
BRIEF MESH DISP 2XL (UNDERPADS AND DIAPERS) ×1 IMPLANT
BRUSH SCRUB EZ 4% CHG (MISCELLANEOUS) ×1 IMPLANT
DERMABOND ADVANCED .7 DNX12 (GAUZE/BANDAGES/DRESSINGS) IMPLANT
DRAIN CHANNEL JP 15F RND 3/16 (MISCELLANEOUS) IMPLANT
DRAPE LAPAROTOMY 100X77 ABD (DRAPES) ×1 IMPLANT
DRSG TEGADERM 4X4.75 (GAUZE/BANDAGES/DRESSINGS) IMPLANT
ELECTRODE REM PT RTRN 9FT ADLT (ELECTROSURGICAL) ×1 IMPLANT
EVACUATOR SILICONE 100CC (DRAIN) IMPLANT
GAUZE 4X4 16PLY ~~LOC~~+RFID DBL (SPONGE) IMPLANT
GAUZE SPONGE 4X4 12PLY STRL (GAUZE/BANDAGES/DRESSINGS) IMPLANT
GLOVE BIOGEL PI IND STRL 7.0 (GLOVE) ×1 IMPLANT
GLOVE SURG SYN 6.5 PF PI (GLOVE) ×3 IMPLANT
GOWN STRL REUS W/ TWL LRG LVL3 (GOWN DISPOSABLE) ×3 IMPLANT
KIT TURNOVER KIT A (KITS) ×1 IMPLANT
MANIFOLD NEPTUNE II (INSTRUMENTS) ×1 IMPLANT
NDL HYPO 22X1.5 SAFETY MO (MISCELLANEOUS) ×1 IMPLANT
NEEDLE HYPO 22X1.5 SAFETY MO (MISCELLANEOUS) ×1 IMPLANT
NS IRRIG 500ML POUR BTL (IV SOLUTION) ×1 IMPLANT
PACK BASIN MINOR ARMC (MISCELLANEOUS) ×1 IMPLANT
PAD ABD DERMACEA PRESS 5X9 (GAUZE/BANDAGES/DRESSINGS) ×1 IMPLANT
SOLN STERILE WATER 500 ML (IV SOLUTION) ×1 IMPLANT
SOLUTION PREP PVP 2OZ (MISCELLANEOUS) ×1 IMPLANT
SPONGE DRAIN TRACH 4X4 STRL 2S (GAUZE/BANDAGES/DRESSINGS) IMPLANT
SUT NYLON 3 0 (SUTURE) IMPLANT
SUT VIC AB 3-0 SH 27X BRD (SUTURE) IMPLANT
SUTURE MNCRL 4-0 27XMF (SUTURE) IMPLANT
SYR 10ML LL (SYRINGE) ×1 IMPLANT
SYR 20ML LL LF (SYRINGE) ×1 IMPLANT
SYR BULB IRRIG 60ML STRL (SYRINGE) ×1 IMPLANT
TAPE CLOTH 3X10 WHT NS LF (GAUZE/BANDAGES/DRESSINGS) ×1 IMPLANT
TRAP FLUID SMOKE EVACUATOR (MISCELLANEOUS) ×1 IMPLANT

## 2023-12-12 NOTE — Transfer of Care (Signed)
 Immediate Anesthesia Transfer of Care Note  Patient: Shawn Stevens  Procedure(s) Performed: EXCISION, SIMPLE PILONIDAL CYST PROCEDURE, ADVANCEMENT FLAP, BACK  Patient Location: PACU  Anesthesia Type:General  Level of Consciousness: drowsy and patient cooperative  Airway & Oxygen Therapy: Patient Spontanous Breathing and Patient connected to face mask oxygen  Post-op Assessment: Report given to RN and Post -op Vital signs reviewed and stable  Post vital signs: Reviewed and stable  Last Vitals:  Vitals Value Taken Time  BP 95/54 12/12/23 11:45  Temp 36.1 C 12/12/23 11:39  Pulse 51 12/12/23 11:59  Resp 16 12/12/23 11:59  SpO2 94 % 12/12/23 11:59  Vitals shown include unfiled device data.  Last Pain:  Vitals:   12/12/23 1145  PainSc: Asleep         Complications: No notable events documented.

## 2023-12-12 NOTE — Op Note (Signed)
 Pre-Op Dx: pilonidal disease Post-Op Dx: same Anesthesia: GETA EBL: 15mL Complications:  none apparent Specimen: pilonidal disease Procedure: pilonidal cystectomy and bascom flap closure  Surgeon: Tye  Indications for procedure: See H&P  Description of Procedure:  Consent obtained, time out performed.  Patient placed in prone position.  Area sterilized and draped in usual position.  Gluteal fold approximation marked and then separted with tape.  Time out performed. Local infused to area previously marked.  18cm elliptical incision made through dermis with 15blade, incorporating the diseased tissue adjacent to the medial edge of ellipse and pilonidal disease noted in subcutaneous layer.  Skin flap was then raised on the contralateral aspect of the more diseased portion (right side in this patient) to the extent of previously marked fold line.  Tape released and flap noted to extend over midline to contralateral fold mark under minimal tension. The diseased portion was then excised completely down to healthy sacral fascia and surrounding fat, passed off field pending pathology.    Wound irrigated and hemostasis noted, then flap advanced over open wound and closed in multi  layer fashion with 3-0 vicryl in interrupted fashion for deep subq layers.  15Fr drain placed right under dermal layer and flap and secured to skin using 3-0 nylon. Additional 3-0 vicryl used to approximate the dermal layer, then running 4-0 monocryl in subcuticular fashion for epidermal layer.  Additional interrupted 4-0 monocryl placed in areas of increased tension compared to rest of wound for additional approximation. Wound then dressed with dermabond, drain sponge around drain site.  Pt tolerated procedure well, and transferred to PACU in stable condition. Sponge and instrument count correct at end of procedure.

## 2023-12-12 NOTE — Interval H&P Note (Signed)
 No change. OK to proceed.

## 2023-12-12 NOTE — Anesthesia Preprocedure Evaluation (Addendum)
 Anesthesia Evaluation  Patient identified by MRN, date of birth, ID band Patient awake    Reviewed: Allergy & Precautions, H&P , NPO status , Patient's Chart, lab work & pertinent test results  Airway Mallampati: III  TM Distance: >3 FB Neck ROM: full    Dental no notable dental hx.    Pulmonary sleep apnea and Continuous Positive Airway Pressure Ventilation    Pulmonary exam normal        Cardiovascular hypertension, Normal cardiovascular exam+ dysrhythmias (RBBB)      Neuro/Psych negative neurological ROS  negative psych ROS   GI/Hepatic negative GI ROS, Neg liver ROS,,,  Endo/Other  diabetes, Type 2, Insulin Dependent    Renal/GU      Musculoskeletal   Abdominal  (+) + obese  Peds  Hematology negative hematology ROS (+)   Anesthesia Other Findings Past Medical History: No date: Anemia No date: BPH (benign prostatic hypertrophy) 04/2020: COVID-19 No date: Diabetes mellitus (HCC) No date: 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) No date: ED (erectile dysfunction) No date: GERD (gastroesophageal reflux disease) No date: Heart disease No date: History of kidney stones No date: HLD (hyperlipidemia) No date: HTN (hypertension) No date: Over weight 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 No date: Sleep apnea     Comment:  uses cpap  Past Surgical History: 01/22/2009: BACK SURGERY 2019: CATARACT EXTRACTION, BILATERAL No date: COLONOSCOPY     Comment:  2004, 2007, 2013, 2019 09/18/2022: COLONOSCOPY; N/A     Comment:  Procedure: COLONOSCOPY;  Surgeon: Maryruth Ole DASEN,               MD;  Location: [facility];  Service: Endoscopy;                Laterality: N/A; 07/01/2020: CYSTOSCOPY/URETEROSCOPY/HOLMIUM LASER/STENT PLACEMENT;  Left     Comment:  Procedure: CYSTOSCOPY/URETEROSCOPY/HOLMIUM LASER/STENT               PLACEMENT;  Surgeon: Francisca Redell BROCKS, MD;  Location:                [facility] OR[facility];  Service: Urology;  Laterality: Left; 01/01/2003: ESOPHAGOGASTRODUODENOSCOPY 06/22/2009: LUMBAR LAMINECTOMY     Comment:  L4,5 2019: MOHS SURGERY     Comment:  nose 03/09/2022: PILONIDAL CYST EXCISION; N/A     Comment:  Procedure: CYST EXCISION PILONIDAL EXTENSIVE;  Surgeon:               Tye Millet, DO;  Location: [facility] OR[facility];  Service: General;              Laterality: N/A; 09/18/2022: POLYPECTOMY     Comment:  Procedure: POLYPECTOMY INTESTINAL;  Surgeon: Maryruth Ole DASEN, MD;  Location: [facility];  Service:               Endoscopy;;     Reproductive/Obstetrics negative OB ROS                              Anesthesia Physical Anesthesia Plan  ASA: 2  Anesthesia Plan: General ETT   Post-op Pain Management: Tylenol  PO (pre-op)*, Celebrex  PO (pre-op)* and Gabapentin  PO (pre-op)*   Induction: Intravenous  PONV Risk Score and Plan: 2 and Ondansetron , Dexamethasone  and Midazolam  Airway Management Planned: Oral ETT  Additional Equipment:   Intra-op Plan:   Post-operative Plan: Extubation in OR  Informed Consent:  I have reviewed the patients History and Physical, chart, labs and discussed the procedure including the risks, benefits and alternatives for the proposed anesthesia with the patient or authorized representative who has indicated his/her understanding and acceptance.     Dental Advisory Given  Plan Discussed with: CRNA and Surgeon  Anesthesia Plan Comments:          Anesthesia Quick Evaluation

## 2023-12-12 NOTE — Anesthesia Procedure Notes (Signed)
 Procedure Name: Intubation Date/Time: 12/12/2023 10:19 AM  Performed by: Lorriane Arabia, CRNAPre-anesthesia Checklist: Patient identified, Patient being monitored, Timeout performed, Emergency Drugs available and Suction available Patient Re-evaluated:Patient Re-evaluated prior to induction Oxygen Delivery Method: Circle system utilized Preoxygenation: Pre-oxygenation with 100% oxygen Induction Type: IV induction Ventilation: Mask ventilation without difficulty Laryngoscope Size: 3 and McGrath Grade View: Grade II Tube type: Oral Tube size: 7.0 mm Number of attempts: 1 Airway Equipment and Method: Stylet Placement Confirmation: ETT inserted through vocal cords under direct vision, positive ETCO2 and breath sounds checked- equal and bilateral Secured at: 22 cm Tube secured with: Tape Dental Injury: Teeth and Oropharynx as per pre-operative assessment

## 2023-12-12 NOTE — Anesthesia Postprocedure Evaluation (Signed)
 Anesthesia Post Note  Patient: Shawn Stevens  Procedure(s) Performed: EXCISION, SIMPLE PILONIDAL CYST PROCEDURE, ADVANCEMENT FLAP, BACK  Patient location during evaluation: PACU Anesthesia Type: General Level of consciousness: awake and alert Pain management: pain level controlled Vital Signs Assessment: post-procedure vital signs reviewed and stable Respiratory status: spontaneous breathing, nonlabored ventilation and respiratory function stable Cardiovascular status: blood pressure returned to baseline and stable Postop Assessment: no apparent nausea or vomiting Anesthetic complications: no   No notable events documented.   Last Vitals:  Vitals:   12/12/23 1215 12/12/23 1249  BP: 115/72 134/73  Pulse: (!) 48 (!) 53  Resp: 16 15  Temp:  (!) 36.1 C  SpO2: 95% 98%    Last Pain:  Vitals:   12/12/23 1249  PainSc: 3                  Camellia Merilee Louder

## 2023-12-12 NOTE — Discharge Instructions (Addendum)
 Removal, Care After This sheet gives you information about how to care for yourself after your procedure. Your health care provider may also give you more specific instructions. If you have problems or questions, contact your health care provider. What can I expect after the procedure? After the procedure, it is common to have: Soreness. Bruising. Itching. Follow these instructions at home: site care Follow instructions from your health care provider about how to take care of your site. Make sure you: Wash your hands with soap and water  before and after you change your bandage (dressing). If soap and water  are not available, use hand sanitizer. Leave stitches (sutures), skin glue, or adhesive strips in place. These skin closures may need to stay in place for 2 weeks or longer. If adhesive strip edges start to loosen and curl up, you may trim the loose edges. Do not remove adhesive strips completely unless your health care provider tells you to do that. If the area bleeds or bruises, apply gentle pressure for 10 minutes. OK TO SHOWER IN 24HRS  Check your site every day for signs of infection. Check for: Redness, swelling, or pain. Fluid or blood. Warmth. Pus or a bad smell.  General instructions Rest and then return to your normal activities as told by your health care provider.  tylenol  as needed for discomfort.      Use narcotics, if prescribed, only when tylenol  is not enough to control pain.  325-650mg  every 8hrs to max of 3000mg /24hrs (including the 325mg  in every norco dose) for the tylenol .   Resume aspirin  in 48 hours keep all follow-up visits as told by your health care provider. This is important. Contact a health care provider if: You have redness, swelling, or pain around your site. You have fluid or blood coming from your site. Your site feels warm to the touch. You have pus or a bad smell coming from your site. You have a fever. Your sutures, skin glue, or adhesive  strips loosen or come off sooner than expected. Get help right away if: You have bleeding that does not stop with pressure or a dressing. Summary After the procedure, it is common to have some soreness, bruising, and itching at the site. Follow instructions from your health care provider about how to take care of your site. Check your site every day for signs of infection. Contact a health care provider if you have redness, swelling, or pain around your site, or your site feels warm to the touch. Keep all follow-up visits as told by your health care provider. This is important. This information is not intended to replace advice given to you by your health care provider. Make sure you discuss any questions you have with your health care provider. Document Released: 02/04/2015 Document Revised: 07/08/2017 Document Reviewed: 07/08/2017 Elsevier Interactive Patient Education  [employer].

## 2023-12-13 ENCOUNTER — Encounter: Payer: Self-pay | Admitting: Surgery

## 2023-12-16 ENCOUNTER — Other Ambulatory Visit: Payer: Self-pay

## 2023-12-16 LAB — SURGICAL PATHOLOGY

## 2023-12-25 ENCOUNTER — Encounter: Payer: Self-pay | Admitting: Surgery

## 2024-01-06 NOTE — Progress Notes (Unsigned)
 01/10/2024 10:57 AM   Shawn Stevens 1950/08/30 [id number]  Referring provider: Don Lauraine Collar, NP [address] [place],  [place] [address]  Urological history: 1. BPH with LU TS -PSA pending  2. ED -contributing factors of age,  BPH, DM, HTN, HLD and sleep apnea -Intolerable to PDE 5 inhibitors -Not interested in further treatments or management  3. Nephrolithiasis -left URS 2022-5 mm ureteral stone -Passed a stone spontaneously in the 1980s  HPI: Shawn Stevens is a 73 y.o. male who presents today for 1 year follow-up.  Previous records reviewed.     I PSS ***  He reports sensation of incomplete bladder emptying,   urinary frequency,   urinary intermittency,   urinary urgency,   a weak urinary stream,   having to strain to void,   nocturia x ***,   leaking before being able to reach the restroom,   leaking with coughing,   leaking without awareness,   and post void dribbling.     He is wearing *** pads//depends  daily.    Patient denies any modifying or aggravating factors.  Patient denies any recent UTI's, gross hematuria, dysuria or suprapubic/flank pain.  Patient denies any fevers, chills, nausea or vomiting.  ***  He has a family history of PCa, colon cancer, ovarian cancer and/or breast cancer with ***.   He does not have a family history of PCa, colon cancer, ovarian cancer, and/or breast cancer .***     UA***  PVR***  PSA pending   Serum creatinine (11/2023) 0.82  Hemoglobin A1c (09/2023) 6.7  BPH meds: Tamsulosin  0.4 mg daily  Fluid consumption: ***    PMH: Past Medical History:  Diagnosis Date   Anemia    BPH (benign prostatic hypertrophy)    COVID-19 04/2020   Diabetes mellitus (HCC)    Diabetic retinopathy associated with type 2 diabetes mellitus (HCC)    ED (erectile dysfunction)    GERD (gastroesophageal reflux disease)    Heart disease    History of kidney stones    HLD (hyperlipidemia)    HTN  (hypertension)    Over weight    Pilonidal disease    Sleep apnea    uses cpap    Surgical History: Past Surgical History:  Procedure Laterality Date   BACK SURGERY  01/22/2009   CATARACT EXTRACTION, BILATERAL  2019   COLONOSCOPY     2004, 2007, 2013, 2019   COLONOSCOPY N/A 09/18/2022   Procedure: COLONOSCOPY;  Surgeon: Maryruth Ole DASEN, MD;  Location: [facility];  Service: Endoscopy;  Laterality: N/A;   CYSTOSCOPY/URETEROSCOPY/HOLMIUM LASER/STENT PLACEMENT Left 07/01/2020   Procedure: CYSTOSCOPY/URETEROSCOPY/HOLMIUM LASER/STENT PLACEMENT;  Surgeon: Francisca Redell BROCKS, MD;  Location: [facility];  Service: Urology;  Laterality: Left;   ESOPHAGOGASTRODUODENOSCOPY  01/01/2003   LUMBAR LAMINECTOMY  06/22/2009   L4,5   MOHS SURGERY  2019   nose   PILONIDAL CYST EXCISION N/A 03/09/2022   Procedure: CYST EXCISION PILONIDAL EXTENSIVE;  Surgeon: Tye Millet, DO;  Location: [facility];  Service: General;  Laterality: N/A;   PILONIDAL CYST EXCISION N/A 12/12/2023   Procedure: EXCISION, SIMPLE PILONIDAL CYST;  Surgeon: Tye Millet, DO;  Location: [facility];  Service: General;  Laterality: N/A;   POLYPECTOMY  09/18/2022   Procedure: POLYPECTOMY INTESTINAL;  Surgeon: Maryruth Ole DASEN, MD;  Location: [facility];  Service: Endoscopy;;   PROCEDURE, ADVANCEMENT FLAP, BACK N/A 12/12/2023   Procedure: PROCEDURE, ADVANCEMENT FLAP, BACK;  Surgeon: Tye Millet, DO;  Location: [facility];  Service: General;  Laterality: N/A;  [place] Flap    Home Medications:  Allergies as of 01/10/2024   No Known Allergies      Medication List        Accurate as of January 06, 2024 10:57 AM. If you have any questions, ask your nurse or doctor.          acetaminophen  325 MG tablet Commonly known as: Tylenol  Take 2 tablets (650 mg total) by mouth every 8 (eight) hours as needed for mild pain (pain score 1-3).   aspirin EC 81 MG tablet Take 81 mg by mouth in the morning. Swallow whole.    Cholecalciferol 25 MCG (1000 UT) tablet Take 1,000 Units by mouth in the morning.   cyanocobalamin  500 MCG tablet Commonly known as: VITAMIN B12 Take 500 mcg by mouth in the morning.   ferrous sulfate 325 (65 FE) MG tablet Take 325 mg by mouth daily with breakfast.   glucose blood test strip Use 1 strip via meter twice daily as directed.   insulin NPH-regular Human (70-30) 100 UNIT/ML injection Inject 50-55 Units into the skin 2 (two) times daily with a meal.   INSULIN SYRINGE .5CC/31GX5/16 31G X 5/16 0.5 ML Misc as directed (twice daily insulin dosing.)   losartan 100 MG tablet Commonly known as: COZAAR Take 100 mg by mouth daily. Start when finished with lisinopril rx   Magnesium 250 MG Tabs Take 250 mg by mouth in the morning.   metFORMIN 1000 MG tablet Commonly known as: GLUCOPHAGE Take 1,000 mg by mouth 2 (two) times daily with a meal.   omeprazole 20 MG capsule Commonly known as: PRILOSEC Take 20 mg by mouth every other day.   oxyCODONE -acetaminophen  5-325 MG tablet Commonly known as: Percocet Take 1 tablet by mouth every 8 (eight) hours as needed for severe pain (pain score 7-10).   pyridoxine 100 MG tablet Commonly known as: B-6 Take 100 mg by mouth in the morning.   simvastatin 10 MG tablet Commonly known as: ZOCOR Take 5 mg by mouth every evening.   tamsulosin  0.4 MG Caps capsule Commonly known as: FLOMAX  Take 1 capsule (0.4 mg total) by mouth every evening.        Allergies: No Known Allergies  Family History: Family History  Problem Relation Age of Onset   Pancreatic cancer Maternal Grandmother    Heart disease Father    Kidney disease Neg Hx    Prostate cancer Neg Hx    Bladder Cancer Neg Hx     Social History:  reports that he has never smoked. He has never been exposed to tobacco smoke. He has never used smokeless tobacco. He reports current alcohol use. He reports that he does not use drugs.  ROS: For pertinent review of systems  please refer to history of present illness  Physical Exam: There were no vitals taken for this visit.  Constitutional:  Well nourished. Alert and oriented, No acute distress. HEENT: [place] AT, moist mucus membranes.  Trachea midline, no masses. Cardiovascular: No clubbing, cyanosis, or edema. Respiratory: Normal respiratory effort, no increased work of breathing. GI: Abdomen is soft, non tender, non distended, no abdominal masses. Liver and spleen not palpable.  No hernias appreciated.  Stool sample for occult testing is not indicated.   GU: No CVA tenderness.  No bladder fullness or masses.  Patient with circumcised/uncircumcised phallus. ***Foreskin easily retracted***  Urethral meatus is patent.  No penile discharge. No penile lesions or rashes. Scrotum without lesions, cysts,  rashes and/or edema.  Testicles are located scrotally bilaterally. No masses are appreciated in the testicles. Left and right epididymis are normal. Rectal: Patient with  normal sphincter tone. Anus and perineum without scarring or rashes. No rectal masses are appreciated. Prostate is approximately *** grams, *** nodules are appreciated. Seminal vesicles are normal. Skin: No rashes, bruises or suspicious lesions. Lymph: No cervical or inguinal adenopathy. Neurologic: Grossly intact, no focal deficits, moving all 4 extremities. Psychiatric: Normal mood and affect.   Laboratory Data: See Epic and HPI   I have reviewed the labs.    Pertinent Imaging N/A  Assessment & Plan:    1. BPH with LU TS - mild, moderate severe symptoms *** and he is *** - no signs of retention, infection or malignancy *** - PSA pending  - DRE benign *** - UA benign *** - PVR < 300 cc *** - most bothersome symptoms are *** - encouraged avoiding bladder irritants, fluid restriction before bedtime and timed voiding's - Initiate alpha-blocker (***), discussed side effects *** - Initiate 5 alpha reductase inhibitor (***), discussed side  effects *** - Continue tamsulosin  0.4 mg daily, alfuzosin 10 mg daily, Rapaflo 8 mg daily, terazosin, doxazosin, Cialis 5 mg daily and finasteride 5 mg daily, dutasteride 0.5 mg daily***:refills given - Cannot tolerate medication or medication failure, schedule cystoscopy *** - educated on red flag symptoms: acute retention, gross hematuria, fever, severe pain - advised to call clinic or go to the ED if these occur - return to clinic in *** symptom re-evaluation ***      No follow-ups on file.  Shawn Stevens [facility] [address] [place], [place] [address] [phone]

## 2024-01-10 ENCOUNTER — Encounter: Payer: Self-pay | Admitting: Urology

## 2024-01-10 ENCOUNTER — Ambulatory Visit: Payer: Self-pay | Admitting: Urology

## 2024-01-10 VITALS — BP 135/66 | HR 62 | Wt 230.0 lb

## 2024-01-10 DIAGNOSIS — N401 Enlarged prostate with lower urinary tract symptoms: Secondary | ICD-10-CM

## 2024-01-10 DIAGNOSIS — N138 Other obstructive and reflux uropathy: Secondary | ICD-10-CM

## 2024-01-10 LAB — URINALYSIS, COMPLETE
Bilirubin, UA: NEGATIVE
Glucose, UA: NEGATIVE
Ketones, UA: NEGATIVE
Leukocytes,UA: NEGATIVE
Nitrite, UA: NEGATIVE
Protein,UA: NEGATIVE
RBC, UA: NEGATIVE
Specific Gravity, UA: 1.015 (ref 1.005–1.030)
Urobilinogen, Ur: 2 mg/dL — ABNORMAL HIGH (ref 0.2–1.0)
pH, UA: 7 (ref 5.0–7.5)

## 2024-01-10 LAB — MICROSCOPIC EXAMINATION
Bacteria, UA: NONE SEEN
Epithelial Cells (non renal): NONE SEEN /HPF (ref 0–10)
RBC, Urine: NONE SEEN /HPF (ref 0–2)
WBC, UA: NONE SEEN /HPF (ref 0–5)

## 2024-01-11 LAB — PSA: Prostate Specific Ag, Serum: 0.2 ng/mL (ref 0.0–4.0)

## 2024-01-12 ENCOUNTER — Ambulatory Visit: Payer: Self-pay | Admitting: Urology

## 2024-01-28 NOTE — Progress Notes (Signed)
 Shawn Stevens                                          MRN: [id number]   01/28/2024   The [facility] Quality Team Specialist reviewed this patient medical record for the purposes of chart review for care gap closure. The following were reviewed: abstraction for care gap closure-controlling blood pressure.    [facility] Quality Team

## 2024-02-12 ENCOUNTER — Other Ambulatory Visit: Payer: Self-pay | Admitting: Urology

## 2024-02-12 DIAGNOSIS — N138 Other obstructive and reflux uropathy: Secondary | ICD-10-CM

## 2025-01-05 ENCOUNTER — Other Ambulatory Visit

## 2025-01-11 ENCOUNTER — Ambulatory Visit: Admitting: Urology
